# Patient Record
Sex: Female | Born: 1935 | Race: White | Hispanic: No | Marital: Married | Smoking: Never smoker
Health system: Southern US, Community
[De-identification: ages and names within clinical notes are randomized; demographics above are authoritative.]

## PROBLEM LIST (undated history)

## (undated) DIAGNOSIS — I639 Cerebral infarction, unspecified: Secondary | ICD-10-CM

## (undated) DIAGNOSIS — I10 Essential (primary) hypertension: Secondary | ICD-10-CM

## (undated) DIAGNOSIS — E039 Hypothyroidism, unspecified: Secondary | ICD-10-CM

## (undated) DIAGNOSIS — I482 Chronic atrial fibrillation, unspecified: Secondary | ICD-10-CM

## (undated) DIAGNOSIS — D72819 Decreased white blood cell count, unspecified: Secondary | ICD-10-CM

## (undated) DIAGNOSIS — D696 Thrombocytopenia, unspecified: Secondary | ICD-10-CM

## (undated) DIAGNOSIS — M1990 Unspecified osteoarthritis, unspecified site: Secondary | ICD-10-CM

## (undated) HISTORY — DX: Thrombocytopenia, unspecified: D69.6

## (undated) HISTORY — DX: Decreased white blood cell count, unspecified: D72.819

## (undated) HISTORY — DX: Essential (primary) hypertension: I10

## (undated) HISTORY — DX: Chronic atrial fibrillation, unspecified: I48.20

## (undated) HISTORY — DX: Hypothyroidism, unspecified: E03.9

## (undated) HISTORY — PX: HERNIA REPAIR: SHX51

---

## 1998-06-28 ENCOUNTER — Other Ambulatory Visit: Admission: RE | Admit: 1998-06-28 | Discharge: 1998-06-28 | Payer: Self-pay | Admitting: *Deleted

## 1999-10-25 ENCOUNTER — Encounter: Payer: Self-pay | Admitting: *Deleted

## 1999-10-25 ENCOUNTER — Encounter: Admission: RE | Admit: 1999-10-25 | Discharge: 1999-10-25 | Payer: Self-pay | Admitting: *Deleted

## 1999-11-01 ENCOUNTER — Encounter: Admission: RE | Admit: 1999-11-01 | Discharge: 1999-11-01 | Payer: Self-pay | Admitting: *Deleted

## 1999-11-01 ENCOUNTER — Other Ambulatory Visit: Admission: RE | Admit: 1999-11-01 | Discharge: 1999-11-01 | Payer: Self-pay | Admitting: *Deleted

## 1999-11-01 ENCOUNTER — Encounter: Payer: Self-pay | Admitting: *Deleted

## 1999-11-20 ENCOUNTER — Encounter: Payer: Self-pay | Admitting: *Deleted

## 1999-11-20 ENCOUNTER — Encounter: Admission: RE | Admit: 1999-11-20 | Discharge: 1999-11-20 | Payer: Self-pay | Admitting: *Deleted

## 2002-10-14 ENCOUNTER — Encounter: Admission: RE | Admit: 2002-10-14 | Discharge: 2002-10-14 | Payer: Self-pay | Admitting: Internal Medicine

## 2002-10-14 ENCOUNTER — Encounter (HOSPITAL_BASED_OUTPATIENT_CLINIC_OR_DEPARTMENT_OTHER): Payer: Self-pay | Admitting: Internal Medicine

## 2005-06-26 ENCOUNTER — Inpatient Hospital Stay (HOSPITAL_COMMUNITY): Admission: EM | Admit: 2005-06-26 | Discharge: 2005-06-29 | Payer: Self-pay | Admitting: Emergency Medicine

## 2005-06-28 ENCOUNTER — Encounter (INDEPENDENT_AMBULATORY_CARE_PROVIDER_SITE_OTHER): Payer: Self-pay | Admitting: *Deleted

## 2008-01-08 ENCOUNTER — Ambulatory Visit (HOSPITAL_COMMUNITY): Admission: RE | Admit: 2008-01-08 | Discharge: 2008-01-08 | Payer: Self-pay | Admitting: General Surgery

## 2010-11-18 ENCOUNTER — Emergency Department (HOSPITAL_COMMUNITY): Payer: No Typology Code available for payment source

## 2010-11-18 ENCOUNTER — Emergency Department (HOSPITAL_COMMUNITY)
Admission: EM | Admit: 2010-11-18 | Discharge: 2010-11-18 | Disposition: A | Discharge status: Home / Self Care | Payer: No Typology Code available for payment source | Attending: Emergency Medicine | Admitting: Emergency Medicine

## 2010-11-18 DIAGNOSIS — W1809XA Striking against other object with subsequent fall, initial encounter: Secondary | ICD-10-CM | POA: Insufficient documentation

## 2010-11-18 DIAGNOSIS — S1093XA Contusion of unspecified part of neck, initial encounter: Secondary | ICD-10-CM | POA: Insufficient documentation

## 2010-11-18 DIAGNOSIS — Z79899 Other long term (current) drug therapy: Secondary | ICD-10-CM | POA: Insufficient documentation

## 2010-11-18 DIAGNOSIS — E7800 Pure hypercholesterolemia, unspecified: Secondary | ICD-10-CM | POA: Insufficient documentation

## 2010-11-18 DIAGNOSIS — M25519 Pain in unspecified shoulder: Secondary | ICD-10-CM | POA: Insufficient documentation

## 2010-11-18 DIAGNOSIS — Y9229 Other specified public building as the place of occurrence of the external cause: Secondary | ICD-10-CM | POA: Insufficient documentation

## 2010-11-18 DIAGNOSIS — S0003XA Contusion of scalp, initial encounter: Secondary | ICD-10-CM | POA: Insufficient documentation

## 2010-11-18 DIAGNOSIS — S0990XA Unspecified injury of head, initial encounter: Secondary | ICD-10-CM | POA: Insufficient documentation

## 2010-11-18 DIAGNOSIS — Z8673 Personal history of transient ischemic attack (TIA), and cerebral infarction without residual deficits: Secondary | ICD-10-CM | POA: Insufficient documentation

## 2010-11-18 DIAGNOSIS — IMO0002 Reserved for concepts with insufficient information to code with codable children: Secondary | ICD-10-CM | POA: Insufficient documentation

## 2010-12-26 NOTE — Op Note (Signed)
NAME:  Katelyn [name], Katelyn [name]                ACCOUNT NO.:  [id number]   MEDICAL RECORD NO.:  [id number]          PATIENT TYPE:  AMB   LOCATION:  DAY                          FACILITY:  [facility]   PHYSICIAN:  Anselm Pancoast. Weatherly, M.D.DATE OF BIRTH:  24-Oct-1921   DATE OF PROCEDURE:  01/08/2008  DATE OF DISCHARGE:                               OPERATIVE REPORT   PREOPERATIVE DIAGNOSES:  Right inguinal hernia.   POSTOPERATIVE DIAGNOSIS:  Right inguinal hernia, direct.   OPERATION:  Right inguinal herniorrhaphy with __________ repair, local  and sedation.   HISTORY:  Triniti Gruetzmacher is an 77 year old Caucasian female who was  referred to me by Dr. Jarome Matin for a symptomatic right inguinal  hernia.  She has a history of mild blood pressure and I had done a  breast biopsy on her years ago and she is usually on Plavix but we  discontinued the Plavix approximately a week ago and she has been using  a baby aspirin during the interim.  She, with straining, has a definite  bulge in the right medial groin area.  She has got very kind of thin,  relaxed abdominal musculature but no other obvious hernia.  I found no  evidence of any femoral component and she is here today for the planned  procedure with local anesthesia and sedation.   DESCRIPTION OF PROCEDURE:  Preoperatively she was given a gram of Ancef  and positioned on the OR table.  The area had been marked at the site  __________ etc., a time-out completed and then the right side inguinal  pubic hair was clipped and then prepped with Betadine solution and  draped in a sterile manner.  A total of about 41 mL of 0.25% Marcaine  and 0.5% Xylocaine with adrenaline was used.  The inguinal incision area  was infiltrated after a Betadine prep with about 10 mL of this solution  and then the ilioinguinal nerve area was anesthetized, the anterior  iliac crest with about 10 mL with a blunted 22-gauge needle.  Sharp  dissection down through the skin and  subcutaneous tissue.  The  superficial veins x2 were identified, clamped, and ligated with 5 Vicryl  and then the external oblique aponeurosis was identified.  This was  followed down to the inguinal ring area and then I opened the external  oblique and used a Weitlaner.  Next, the component, whether it was  indirect or direct, I could not tell, but after the areas along the  ilioinguinal nerve were protected and separated from the structures and  then I opened up the contents, identifying a little peritoneal defect  but this was really a direct inguinal hernia sac that was all coming  medial to the epigastric vessels.  I opened the transversalis and I  pushed the peritoneum down and then first closed the first layer with  running 2-0 Vicryl and then we reclosed with the 2 tails sutured  together.  Next, I used a 2-0 Prolene starting at the pubis area and  then sutured the inguinal ligament to the conjoined tendon area, and  created  the internal ring area and then went back, suturing the 2 tails  together.  At this point I got her to strain vigorously and there was no  bulge, no excessive tension, and I elected not to place any mesh overlay  on this.  The ilioinguinal nerve was in a good comfortable position, and  then the external oblique was closed with a 2-0 Vicryl, again,  protecting the nerve.  Scarpa's fascia was closed with interrupted 3-0  Vicryl and a 4-0 Dexon subcuticular and benzoin and Steri-Strips on the  skin.  I put additional anesthetic solution in the repair of the floor,  etc., and it all totaled 42 mL, a combination used.  The patient  tolerated the procedure nicely and was awake completely at the  completion of surgery and we will let her decide whether she wants to go  home tonight or spend the night and go  home in the morning.  She will continue all of her chronic medications  for blood pressure and can restart the Plavix tomorrow.  SHE IS ALLERGIC  TO CODEINE.  I  am going to give her Darvocet hopefully for mild pain and  if she needs stronger medicine, we will try some Vicodin.  Hopefully she  can get by with the Darvocet for this.           ______________________________  Anselm Pancoast. Zachery Dakins, M.D.     WJW/MEDQ  D:  01/08/2008  T:  01/08/2008  Job:  [id number]   cc:   Barry Dienes. Eloise Harman, M.D.  Fax: [phone]

## 2010-12-29 NOTE — H&P (Signed)
NAME:  Katelyn [name], Katelyn [name]                ACCOUNT NO.:  [id number]   MEDICAL RECORD NO.:  [id number]          PATIENT TYPE:  INP   LOCATION:  1844                         FACILITY:  [facility]   PHYSICIAN:  Catherine A. Orlin Hilding, M.D.DATE OF BIRTH:  1922/06/13   DATE OF ADMISSION:  06/26/2005  DATE OF DISCHARGE:                                HISTORY & PHYSICAL   A code stroke was called at 2030, onset of symptoms was 2015.   CHIEF COMPLAINT:  Slurred speech with right facial weakness.   HISTORY OF PRESENT ILLNESS:  Ms. Schwenke is an 77 year old, right handed,  white woman with a history of hypertension.  She was at [employer], had  sudden onset of right facial weakness, slurred speech, and drooling at  around 8:15.  Friends called 911 and she arrived in 15 minutes.  Her  symptoms have been improving since she got here.   REVIEW OF SYSTEMS:  Negative for chest pain, shortness of breath, or  headache.   PAST MEDICAL HISTORY:  1.  Hypertension.  2.  Hypothyroidism.  3.  Osteoarthritis.  4.  Environmental allergies.  No diabetes, no coronary artery disease, no surgery.   MEDICATIONS:  She is on some kind of a BP medicine, she does not know what  it is, Allegra, Synthroid, Os-Cal, Centrum Silver, and Celebrex.  There may  be others.  She was not taking aspirin.  We do not know any of the doses.   ALLERGIES:  No known drug allergies.   SOCIAL HISTORY:  She is married, no cigarette use.   FAMILY HISTORY:  Noncontributory.   PHYSICAL EXAMINATION:  VITAL SIGNS:  Temperature is 97.4, pulse 76, BP is  206/110, was 205/103 on the way in.  Respirations 18, 100% sat.  HEENT:  Head is normocephalic, atraumatic.  NECK:  Supple without bruits.  HEART:  Regular rate and rhythm.  LUNGS:  Clear to auscultation.  ABDOMEN:  Positive bowel sounds.  Nontender.  EXTREMITIES:  Without edema.  NEUROLOGIC:  She scores a total of 3 on the NIH Stroke Scale.  One A Level  of consciousness, she is keenly  alert.  One B answering questions, she gets  them both right, gets a 0.  One C following commands, she follows them both,  gets a 0.  Best gaze, she has normal gaze, gets a 0.  Visual fields are  normal.  She gets a 0.  Facial:  She has a minor facial droop.  It is a  little bit waxing and waning.  When she speaks she has an obvious right  facial droop.  When she smiles and shows me her teeth, it is fairly  symmetric.  I gave her a 1 for that.  On motor exam, Five A, left upper  extremity no drift, Five B, right upper extremity just a slight drift, she  gets a 1.  Six A, left lower extremity  no drift.  Six B, right lower  extremity no drift, gets 0.  Seven, no ataxia, gets a 0.  Eight, normal  sensation, gets a 0.  She  does extinguish however.  Nine, no aphasia.  Ten,  no dysarthria.  This is a little variable, again she has a little facial  drooping but she is able to clearly read the words for me.  Eleven, she gets  1 point for slight neglect because she extinguishes the right lower  extremity fairly reliably.   CT of the brain is negative for bleed or any obvious acute.  Hemoglobin is  13.1, hematocrit 37.0, platelets 195, white blood cells 4.8.  I do not  really have anything else back yet.   IMPRESSION:  1.  Left brain stroke or transient ischemic attack.  The symptoms are      resolving.  Her score is 3 on the NIH Stroke Scale.  2.  She also has significant hypertension with blood pressure 206/110.  I      suspect this is subcortical stroke or transient ischemic attack.   PLAN:  1.  Admit her for stroke workup.  I feel that she is not a good TPA      candidate given that she is quite hypertensive above the allowable limit      and she has minimal symptoms with a score of 3.  She is also not a study      candidate because of the low score of 3.  2.  I will start her on aspirin when she passes her swallowing evaluation.      Catherine A. Orlin Hilding, M.D.  Electronically  Signed     CAW/MEDQ  D:  06/26/2005  T:  06/26/2005  Job:  [id number]

## 2010-12-29 NOTE — Discharge Summary (Signed)
NAME:  Katelyn [name], Katelyn [name]                ACCOUNT NO.:  [id number]   MEDICAL RECORD NO.:  [id number]          PATIENT TYPE:  INP   LOCATION:  3018                         FACILITY:  [facility]   PHYSICIAN:  Pramod P. Pearlean Brownie, MD    DATE OF BIRTH:  1922-08-06   DATE OF ADMISSION:  06/26/2005  DATE OF DISCHARGE:  06/29/2005                                 DISCHARGE SUMMARY   ADMISSION DIAGNOSIS:  Transient ischemic attack.   DISCHARGE DIAGNOSES:  1.  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  2.  Hypertension.  3.  Hyperlipidemia.   HOSPITAL COURSE:  Mrs. Fedder is an 77 year old pleasant Caucasian lady who  developed sudden onset of expressive speech difficulties at 8:15 on the day  of admission.  She presented within 15 minutes of onset of her symptoms;  however, since her symptoms were improving, she was not given thrombolysis  with TPA.  A CT scan of the head was unremarkable.  She was admitted to the  Stroke Service was risk stratification.  Telemetry monitoring did not reveal  any cardiac arrhythmia.  A 2-D echocardiogram showed normal ejection  fraction without any obvious cardiac source of embolism.  Transesophageal  echocardiogram was done by Dr. Jacinto Halim on June 28, 2005 which revealed no  obvious cardiac source of embolism.  There was only minimal atheromatous  plaque noted in the aortic arch.  A homocystine level was normal at 12.2.  Hemoglobin A1c was normal at 5.2.  Her cholesterol profile was abnormal with  total cholesterol 203, LDL elevated at 135, HDL 53, triglycerides 77.  The  patient was started on Aggrenox for secondary stroke prevention and Zocor  for elevated lipids.  She remained stable during hospitalization stay and  had no further symptoms.  She was discharged home in a stable condition and  advised to keep her scheduled appointment with her primary physician, Dr.  Jarome Matin, next Tuesday and was advised to see Dr. Pearlean Brownie in his office  in 2 months.  Carotid ultrasound showed no significant carotid stenosis.  Transcranial Doppler study results are pending at the time of discharge.   DISCHARGE MEDICATIONS:  1.  Aggrenox one capsule daily for 2 weeks, to be increased to twice a day.  2.  Zocor 20 mg a day.  3.  Synthroid 75 mcg once a day.  4.  Centrum Silver once a day.  5.  Os-Cal and Celebrex; the patient was advised to resume her prior home      dosages.           ______________________________  Sunny Schlein. Pearlean Brownie, MD     PPS/MEDQ  D:  06/29/2005  T:  06/30/2005  Job:  [id number]   cc:   Barry Dienes. Eloise Harman, M.D.  Fax: [phone]

## 2011-05-09 LAB — DIFFERENTIAL
Basophils Absolute: 0
Basophils Relative: 1
Eosinophils Absolute: 0.1
Eosinophils Relative: 2
Lymphocytes Relative: 33
Lymphs Abs: 1.2
Monocytes Absolute: 0.5
Monocytes Relative: 13 — ABNORMAL HIGH
Neutro Abs: 1.8
Neutrophils Relative %: 51

## 2011-05-09 LAB — COMPREHENSIVE METABOLIC PANEL
ALT: 15
AST: 22
Albumin: 3.5
Alkaline Phosphatase: 62
BUN: 15
CO2: 31
Calcium: 9.1
Chloride: 97
Creatinine, Ser: 0.86
GFR calc Af Amer: >60
GFR calc non Af Amer: >60
Glucose, Bld: 97
Potassium: 3.7
Sodium: 134 — ABNORMAL LOW
Total Bilirubin: 0.8
Total Protein: 6

## 2011-05-09 LAB — CBC
HCT: 34.6 — ABNORMAL LOW
Hemoglobin: 11.9 — ABNORMAL LOW
MCHC: 34.3
MCV: 90.3
Platelets: 144 — ABNORMAL LOW
RBC: 3.83 — ABNORMAL LOW
RDW: 13.4
WBC: 3.5 — ABNORMAL LOW

## 2011-10-04 ENCOUNTER — Emergency Department (HOSPITAL_COMMUNITY)
Admission: EM | Admit: 2011-10-04 | Discharge: 2011-10-04 | Discharge status: Against Medical Advice | Payer: No Typology Code available for payment source | Attending: Emergency Medicine | Admitting: Emergency Medicine

## 2011-10-04 DIAGNOSIS — Z0389 Encounter for observation for other suspected diseases and conditions ruled out: Secondary | ICD-10-CM | POA: Insufficient documentation
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
## 2011-11-25 ENCOUNTER — Emergency Department (HOSPITAL_COMMUNITY): Payer: No Typology Code available for payment source

## 2011-11-25 ENCOUNTER — Encounter (HOSPITAL_COMMUNITY): Payer: Self-pay

## 2011-11-25 ENCOUNTER — Emergency Department (HOSPITAL_COMMUNITY)
Admission: EM | Admit: 2011-11-25 | Discharge: 2011-11-25 | Disposition: A | Discharge status: Home / Self Care | Payer: No Typology Code available for payment source | Attending: Emergency Medicine | Admitting: Emergency Medicine

## 2011-11-25 DIAGNOSIS — R51 Headache: Secondary | ICD-10-CM | POA: Insufficient documentation

## 2011-11-25 DIAGNOSIS — R079 Chest pain, unspecified: Secondary | ICD-10-CM | POA: Insufficient documentation

## 2011-11-25 DIAGNOSIS — M25519 Pain in unspecified shoulder: Secondary | ICD-10-CM | POA: Insufficient documentation

## 2011-11-25 HISTORY — DX: Cerebral infarction, unspecified: I63.9

## 2011-11-25 HISTORY — DX: Unspecified osteoarthritis, unspecified site: M19.90

## 2011-11-25 LAB — POCT I-STAT, CHEM 8
BUN: 21 mg/dL (ref 6–23)
Calcium, Ion: 1.21 mmol/L (ref 1.12–1.32)
Chloride: 103 mEq/L (ref 96–112)
Creatinine, Ser: 1 mg/dL (ref 0.50–1.10)
Glucose, Bld: 112 mg/dL — ABNORMAL HIGH (ref 70–99)
Hemoglobin: 13.6 g/dL (ref 12.0–15.0)
Potassium: 3.5 mEq/L (ref 3.5–5.1)
Sodium: 142 mEq/L (ref 135–145)
TCO2: 29 mmol/L (ref 0–100)

## 2011-11-25 MED ORDER — HYDROCODONE-ACETAMINOPHEN 5-325 MG PO TABS
1.0000 | ORAL_TABLET | Freq: Once | ORAL | Status: AC
  Administered 2011-11-25: 1 via ORAL
  Filled 2011-11-25: qty 1

## 2011-11-25 MED ORDER — HYDROCODONE-ACETAMINOPHEN 5-500 MG PO TABS: 1.0000 | ORAL_TABLET | Freq: Four times a day (QID) | ORAL | Status: AC | PRN

## 2011-11-25 NOTE — ED Notes (Signed)
Patient is AOx4 and comfortable with her discharge instructions. 

## 2011-11-25 NOTE — ED Notes (Addendum)
Family at beside. 1 pair yellow colored earrings and necklace placed in collection cup and into pt's purse, all belongings given to husband at bedside.

## 2011-11-25 NOTE — ED Notes (Signed)
Vital signs stable. 
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
## 2011-11-25 NOTE — ED Notes (Signed)
MD at bedside. 
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
## 2011-11-25 NOTE — ED Provider Notes (Signed)
I saw and evaluated the patient, reviewed the resident's note and I agree with the findings and plan.   .Face to face Exam:  General:  Awake HEENT:  Atraumatic Resp:  Normal effort Abd:  Nondistended Neuro:No focal weakness Lymph: No adenopathy   Nelia Shi, MD 11/25/11 2351
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
## 2011-12-12 ENCOUNTER — Encounter (HOSPITAL_COMMUNITY): Payer: Self-pay | Admitting: *Deleted

## 2011-12-12 ENCOUNTER — Emergency Department (INDEPENDENT_AMBULATORY_CARE_PROVIDER_SITE_OTHER): Payer: No Typology Code available for payment source

## 2011-12-12 ENCOUNTER — Emergency Department (INDEPENDENT_AMBULATORY_CARE_PROVIDER_SITE_OTHER)
Admission: EM | Admit: 2011-12-12 | Discharge: 2011-12-12 | Disposition: A | Discharge status: Home / Self Care | Payer: Self-pay | Source: Home / Self Care | Attending: Emergency Medicine | Admitting: Emergency Medicine

## 2011-12-12 DIAGNOSIS — R911 Solitary pulmonary nodule: Secondary | ICD-10-CM

## 2011-12-12 DIAGNOSIS — R0789 Other chest pain: Secondary | ICD-10-CM

## 2011-12-12 DIAGNOSIS — R071 Chest pain on breathing: Secondary | ICD-10-CM

## 2011-12-12 DIAGNOSIS — S43429A Sprain of unspecified rotator cuff capsule, initial encounter: Secondary | ICD-10-CM

## 2011-12-12 DIAGNOSIS — S46019A Strain of muscle(s) and tendon(s) of the rotator cuff of unspecified shoulder, initial encounter: Secondary | ICD-10-CM

## 2011-12-12 MED ORDER — TRAMADOL HCL 50 MG PO TABS: 100.0000 mg | ORAL_TABLET | Freq: Three times a day (TID) | ORAL | Status: AC | PRN

## 2011-12-12 NOTE — Discharge Instructions (Signed)
Chest Pain (Nonspecific) It is often hard to give a specific diagnosis for the cause of chest pain. There is always a chance that your pain could be related to something serious, such as a heart attack or a blood clot in the lungs. You need to follow up with your caregiver for further evaluation. CAUSES   Heartburn.   Pneumonia or bronchitis.   Anxiety or stress.   Inflammation around your heart (pericarditis) or lung (pleuritis or pleurisy).   A blood clot in the lung.   A collapsed lung (pneumothorax). It can develop suddenly on its own (spontaneous pneumothorax) or from injury (trauma) to the chest.   Shingles infection (herpes zoster virus).  The chest wall is composed of bones, muscles, and cartilage. Any of these can be the source of the pain.  The bones can be bruised by injury.   The muscles or cartilage can be strained by coughing or overwork.   The cartilage can be affected by inflammation and become sore (costochondritis).  DIAGNOSIS  Lab tests or other studies, such as X-rays, electrocardiography, stress testing, or cardiac imaging, may be needed to find the cause of your pain.  TREATMENT   Treatment depends on what may be causing your chest pain. Treatment may include:   Acid blockers for heartburn.   Anti-inflammatory medicine.   Pain medicine for inflammatory conditions.   Antibiotics if an infection is present.   You may be advised to change lifestyle habits. This includes stopping smoking and avoiding alcohol, caffeine, and chocolate.   You may be advised to keep your head raised (elevated) when sleeping. This reduces the chance of acid going backward from your stomach into your esophagus.   Most of the time, nonspecific chest pain will improve within 2 to 3 days with rest and mild pain medicine.  HOME CARE INSTRUCTIONS   If antibiotics were prescribed, take your antibiotics as directed. Finish them even if you start to feel better.   For the next few  days, avoid physical activities that bring on chest pain. Continue physical activities as directed.   Do not smoke.   Avoid drinking alcohol.   Only take over-the-counter or prescription medicine for pain, discomfort, or fever as directed by your caregiver.   Follow your caregiver's suggestions for further testing if your chest pain does not go away.   Keep any follow-up appointments you made. If you do not go to an appointment, you could develop lasting (chronic) problems with pain. If there is any problem keeping an appointment, you must call to reschedule.  SEEK MEDICAL CARE IF:   You think you are having problems from the medicine you are taking. Read your medicine instructions carefully.   Your chest pain does not go away, even after treatment.   You develop a rash with blisters on your chest.  SEEK IMMEDIATE MEDICAL CARE IF:   You have increased chest pain or pain that spreads to your arm, neck, jaw, back, or abdomen.   You develop shortness of breath, an increasing cough, or you are coughing up blood.   You have severe back or abdominal pain, feel nauseous, or vomit.   You develop severe weakness, fainting, or chills.   You have a fever.  THIS IS AN EMERGENCY. Do not wait to see if the pain will go away. Get medical help at once. Call your local emergency services (911 in [place] Do not drive yourself to the hospital. MAKE SURE YOU:   Understand these instructions.     Will watch your condition.   Will get help right away if you are not doing well or get worse.  Document Released: 05/09/2005 Document Revised: 07/19/2011 Document Reviewed: 03/04/2008 [facility] Patient Information 2012 [place], [place].Chest Wall Pain Chest wall pain is pain in or around the bones and muscles of your chest. It may take up to 6 weeks to get better. It may take longer if you must stay physically active in your work and activities.  CAUSES  Chest wall pain may happen on its own. However, it may  be caused by:  A viral illness like the flu.   Injury.   Coughing.   Exercise.   Arthritis.   Fibromyalgia.   Shingles.  HOME CARE INSTRUCTIONS   Avoid overtiring physical activity. Try not to strain or perform activities that cause pain. This includes any activities using your chest or your abdominal and side muscles, especially if heavy weights are used.   Put ice on the sore area.   Put ice in a plastic bag.   Place a towel between your skin and the bag.   Leave the ice on for 15 to 20 minutes per hour while awake for the first 2 days.   Only take over-the-counter or prescription medicines for pain, discomfort, or fever as directed by your caregiver.  SEEK IMMEDIATE MEDICAL CARE IF:   Your pain increases, or you are very uncomfortable.   You have a fever.   Your chest pain becomes worse.   You have new, unexplained symptoms.   You have nausea or vomiting.   You feel sweaty or lightheaded.   You have a cough with phlegm (sputum), or you cough up blood.  MAKE SURE YOU:   Understand these instructions.   Will watch your condition.   Will get help right away if you are not doing well or get worse.  Document Released: 07/30/2005 Document Revised: 07/19/2011 Document Reviewed: 03/26/2011 [facility] Patient Information 2012 [place], [place].Incidental Abnormal Radiological Finding An incidental abnormal radiologic finding is a very small mass or scar tissue, detected anywhere in the body, unrelated to the reason for your visit. With newer imaging tests and technologies, it is becoming more common to detect small masses and tissue abnormalities. It is important for you to work with your caregiver because it can sometimes be related to undiagnosed illness or other symptoms. Most often however, the finding is not causing symptoms and is not a cause for concern. TYPES OF FINDINGS Abnormal radiologic findings are often located in the kidneys or lungs, but they can also be  found in the heart, liver, breasts, brain, gallbladder, uterus and other surrounding organs and tissues. There are many types of masses and tissue abnormalities that can be detected during an imaging test. These may include:  Lesions - changes in tissue due to infection, tissue death, or trauma.   Cysts - a sac filled with fluid, crystals, or some other substance.   Tumors - non-cancerous or cancerous solid formation.  You may hear medical terms, such as, "pulmonary nodule" (a small mass in the lung) or "renal mass" (mass in the kidney). Ask your caregiver if these terms apply to your findings.  DO I NEED FURTHER DIAGNOSIS? There are many possible causes of incidental radiologic findings. Your caregiver will determine whether it requires additional screening tests, diagnostic tests, treatments, or referral to a surgeon.Generally, very small tissue changes or masses will not require any follow up testing. Much research has been done in this area.These very small  abnormalities are considered low risk of becoming a problem in the future.  Depending on the size and appearance of the finding, your caregiver may recommend additional testing.Additional testing may also be recommended if you have certain risk factors or medical conditions that increase your risk of related problems. It is a good idea to have additional testing if you have other symptoms or concerns. Sometimes, these early findings can give you a chance at early treatment and avoid problems in the future. TESTING AND DIAGNOSIS Tests and exams may be a one-time screening or periodic follow-up. Periodic follow-up will help your caregiver determine whether the abnormality is growing and becoming a concern. Tests may include:  Physical examination.   Blood tests.   Urine tests.   Imaging tests, such as abdominal ultrasound, CT scan, or MRI.   Biopsy.  TREATMENT Treatment varies, depending on the cause, location, size and appearance  of the finding. Treatment will also depend on your age and underlying conditions or symptoms. Sometimes treatment is not necessary at all. However, treatment may include:  Watchful waiting with periodic examination and testing.   Treatments to reduce the size of the abnormality.   Surgical or biopsy removal of the abnormality.   Additional treatments to address any underlying conditions.  HOME CARE INSTRUCTIONS   See your caregiver for follow up examination and testing as directed. It is important that you schedule appointments as directed.   Keep calm. Your caregiver will let you know if this is a routine follow up, or if there is a reason for concern. Remember, early detection can be very beneficial to you.   Follow all of your caregiver's aftercare instructions related to the reason for your visit.  Document Released: 11/14/2010 Document Revised: 07/19/2011 Document Reviewed: 11/14/2010 [facility] Patient Information 2012 [place], [place].Ligament Sprain Ligaments are tough, fibrous tissues that hold bones together at the joints. A sprain can occur when a ligament is stretched. This injury may take several weeks to heal. HOME CARE INSTRUCTIONS   Rest the injured area for as long as directed by your caregiver. Then slowly start using the joint as directed by your caregiver and as the pain allows.   Keep the affected joint raised if possible to lessen swelling.   Apply ice for 15 to 20 minutes to the injured area every couple hours for the first half day, then 3 to 4 times per day for the first 48 hours. Put the ice in a plastic bag and place a towel between the bag of ice and your skin.   Wear any splinting, casting, or elastic bandage applications as instructed.   Only take over-the-counter or prescription medicines for pain, discomfort, or fever as directed by your caregiver. Do not use aspirin immediately after the injury unless instructed by your caregiver. Aspirin can cause increased  bleeding and bruising of the tissues.   If you were given crutches, continue to use them as instructed and do not resume weight bearing on the affected extremity until instructed.  SEEK MEDICAL CARE IF:   Your bruising, swelling, or pain increases.   You have cold and numb fingers or toes if your arm or leg was injured.  SEEK IMMEDIATE MEDICAL CARE IF:   Your toes are numb or blue if your leg was injured.   Your fingers are numb or blue if your arm was injured.   Your pain is not responding to medicines and continues to stay the same or gets worse.  MAKE SURE YOU:  Understand these instructions.   Will watch your condition.   Will get help right away if you are not doing well or get worse.  Document Released: 07/27/2000 Document Revised: 07/19/2011 Document Reviewed: 05/25/2008 [facility] Patient Information 2012 [place], [place].Pulmonary Nodule A pulmonary (lung) nodule is small, round growth in the lung. The size of a pulmonary nodule can be as small as a pencil eraser (1/5 inch or 4 millmeters) to a little bigger than your biggest toenail (1 inch or 25 millimeters). A pulmonary nodule is usually an unplanned finding. It may be found on a chest X-ray or a computed tomography (CT) scan when you have imaging tests of your lungs done. When a pulmonary nodule is found, tests will be done to determine if the nodule is benign (not cancerous) or malignant (cancerous). Follow-up treatment or testing is based on the size of the pulmonary nodule and your risk of getting lung cancer.  CAUSES Causes of pulmonary nodules can vary.  Benign pulmonary nodules  can be caused from different things. Some of these things include:  Infection. This can be a common cause of a benign pulmonary nodule. The infection may be active (a current infection) or an old infection that is no longer active. Three types of infections can cause a pulmonary nodule. These are:   Bacterial Infection.   Fungal infection.     Viral Infections.   Hematoma. This is a bruise in the lung. A hematoma can happen from an injury to your chest.   Some common diseases can lead to benign pulmonary nodules. For example, rheumatoid arthritis can be a cause of a pulmonary nodule.   Other unusual things can cause a benign pulmonary nodule. These can include:   Having had tuberculosis.   Rare diseases, such as a lung cyst.  Malignant pulmonary nodules.  These are cancerous growths. The cancer may have:  Started in the lung. Some lung cancers first detected as a pulmonary nodule.   Spread to the lung from cancer somewhere else in the body. This is called metastatic cancer.   Certain risk factors make a cancerous pulmonary nodule more likely. They include:   Age. As people get older, a pulmonary nodule is more likely to be cancerous.   Cancer history. If one of your immediate family members has had cancer, you have a higher risk of developing cancer.   Smoking. This includes people who currently smoke and those who have quit.  DIAGNOSIS To diagnose whether a pulmonary nodule is benign or malignant, a variety of tests will be done. This includes things such as:  Health history. Questions regarding your current health, past health, and family health will be asked.   Blood tests. Results of blood work can show:   Tumor markers for cancer.   Any type of infection.   A skin test called a tuberculin (TB) test may be done. This test can tell if you have been exposed to the germ that causes tuberculosis.   Imaging tests. These take pictures of your lungs. Types of imaging tests include:   Chest X-ray. This can help in several ways. An X-ray gives a close-up look at the pulmonary nodule. A new X-ray can be compared with any X-rays you have had in the past.    Computed tomography  (CT) scan. This test shows smaller pulmonary nodules more clearly than an X-ray.   Positron emission tomography  (PET) scan. This is a  test that uses a radioactive substance to identify a pulmonary  nodule. A safe amount of radioactive substance is injected into the blood stream. Then, the scan takes a picture of the pulmonary nodule. A malignant pulmonary nodule will absorb the substance faster than a benign pulmonary nodule. The radioactive substance is eliminated from your body in your urine.   Biopsy.  This removes a tiny piece of the pulmonary nodule so it can be checked under a microscope. Medicine will be given to help keep you relaxed and pain free when a biopsy is done. Types of biopsies include:   Bronchoscopy . This is a surgical procedure. It can be used for pulmonary nodules that are close to the airways in the lung. It uses a scope (a thin tube) with a tiny camera and light on the end. The scope is put in the windpipe. Your caregiver can then see inside the lung. A tiny tool put through the scope is used to take a small sample of the pulmonary nodule tissue.   Transthoracic needle aspiration . This method is used if the pulmonary nodule is far away from the air passages in the lung. A long, thin needle is put through the chest into the lung nodule. A CT scan is done at the same time which can make it easier to locate the pulmonary nodule.   Surgical lung biopsy . This is a surgical procedure in which the pulmonary nodule is removed. This is usually recommended when the pulmonary nodule is most likely malignant or a biopsy cannot be obtained by either bronchoscopy or transthoracic needle aspiration.  PULMONARY NODULE FOLLOW-UP RECOMMENDATIONS The frequency of pulmonary nodule follow-up is based on your risk factors and size of the pulmonary nodule. If your caregiver suspects the pulmonary nodule is cancerous or the pulmonary nodule changes during any of the follow-up CT scans, additional testing or biopsies will be done.   If you have no or low risk of getting lung cancer (non-smoker, no personal cancer history),  recommended follow-up is based on the following pulmonary nodule size:   A pulmonary nodule that is < 4 mm does not require any follow-up.   A pulmonary nodule that is 4 to 6 mm should be re-imaged by CT scan in 12 months.   A pulmonary nodule that is 6 to 8 mm should be re-imaged by CT scan at 6 to 12 months and then again at 18 to 24 months if no change in size.   A pulmonary nodule > 8 mm in size should be followed closely and re-imaged by CT scan at 3, 9, and 24 months.    If you are at risk of getting lung cancer (current or former smoker, family history of cancer), recommended follow-up is based on the following pulmonary nodule size:   A pulmonary nodule that is < 4 mm in size should be re-imaged by CT scan in 12 months.   A pulmonary nodule that is 4 to 6 mm in size should be re-imaged by CT scan at 6 to 12 months and again at 18 to 24 months.   A pulmonary nodule that is 6 to 8 mm in size should be re-imaged by CT scan at 3, 9, and 24 months.   A pulmonary nodule > 8 mm in size should be followed closely and re-imaged by CT scan at 3, 9, and 24 months.  SEEK MEDICAL CARE IF: While waiting for test results to determine what type of pulmonary nodule you have, be sure to contact your caregiver if you:  Have  trouble breathing when you are active.   Feel sick or unusually tired.   Do not feel like eating.   Lose weight without trying to.   Develop chills or night sweats.   Mild or moderate fevers generally have no long-term effects and often do not require treatment. There are a few exceptions (see below).  SEEK IMMEDIATE MEDICAL CARE IF:  You cannot catch your breath or you begin wheezing.   You cannot stop coughing.   You cough up blood.   You feel like you are going to pass out or become dizzy.   You have sudden chest pain.   You have a fever or persistent symptoms for more than 72 hours.   You have a fever and your symptoms suddenly get worse.  MAKE SURE YOU    Understand these instructions.   Will watch your condition.   Will get help right away if you are not doing well or get worse.  Document Released: 05/27/2009 Document Revised: 07/19/2011 Document Reviewed: 05/27/2009 [facility] Patient Information 2012 [place], [place].    Be sure to follow up with your primary care doctor regarding the spot on your lung.  I will call and let them know.
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
## 2012-05-21 ENCOUNTER — Emergency Department (HOSPITAL_COMMUNITY): Payer: Medicare Other

## 2012-05-21 ENCOUNTER — Other Ambulatory Visit: Payer: Self-pay

## 2012-05-21 ENCOUNTER — Observation Stay (HOSPITAL_COMMUNITY)
Admission: EM | Admit: 2012-05-21 | Discharge: 2012-05-22 | Disposition: A | Discharge status: Home / Self Care | Payer: Medicare Other | Attending: Internal Medicine | Admitting: Internal Medicine

## 2012-05-21 DIAGNOSIS — I10 Essential (primary) hypertension: Secondary | ICD-10-CM | POA: Insufficient documentation

## 2012-05-21 DIAGNOSIS — E039 Hypothyroidism, unspecified: Secondary | ICD-10-CM | POA: Insufficient documentation

## 2012-05-21 DIAGNOSIS — R269 Unspecified abnormalities of gait and mobility: Secondary | ICD-10-CM | POA: Insufficient documentation

## 2012-05-21 DIAGNOSIS — I4891 Unspecified atrial fibrillation: Secondary | ICD-10-CM | POA: Insufficient documentation

## 2012-05-21 DIAGNOSIS — G459 Transient cerebral ischemic attack, unspecified: Secondary | ICD-10-CM | POA: Diagnosis present

## 2012-05-21 DIAGNOSIS — R4789 Other speech disturbances: Secondary | ICD-10-CM | POA: Insufficient documentation

## 2012-05-21 DIAGNOSIS — R29810 Facial weakness: Secondary | ICD-10-CM | POA: Insufficient documentation

## 2012-05-21 DIAGNOSIS — R471 Dysarthria and anarthria: Secondary | ICD-10-CM | POA: Insufficient documentation

## 2012-05-21 DIAGNOSIS — I6350 Cerebral infarction due to unspecified occlusion or stenosis of unspecified cerebral artery: Principal | ICD-10-CM | POA: Insufficient documentation

## 2012-05-21 LAB — CBC
MCHC: 34.3 g/dL (ref 30.0–36.0)
MCV: 89.6 fL (ref 78.0–100.0)
Platelets: 148 10*3/uL — ABNORMAL LOW (ref 150–400)
Platelets: 145 10*3/uL — ABNORMAL LOW (ref 150–400)
RBC: 4.21 MIL/uL (ref 3.87–5.11)
RDW: 13.4 % (ref 11.5–15.5)
RDW: 13.4 % (ref 11.5–15.5)
WBC: 5.1 10*3/uL (ref 4.0–10.5)
WBC: 4 10*3/uL (ref 4.0–10.5)

## 2012-05-21 LAB — DIFFERENTIAL
Basophils Absolute: 0 10*3/uL (ref 0.0–0.1)
Basophils Relative: 0 % (ref 0–1)
Eosinophils Relative: 1 % (ref 0–5)
Lymphocytes Relative: 22 % (ref 12–46)
Neutro Abs: 3.5 10*3/uL (ref 1.7–7.7)

## 2012-05-21 LAB — PROTIME-INR: Prothrombin Time: 14.3 seconds (ref 11.6–15.2)

## 2012-05-21 LAB — COMPREHENSIVE METABOLIC PANEL
ALT: 25 U/L (ref 0–35)
AST: 30 U/L (ref 0–37)
Albumin: 3.9 g/dL (ref 3.5–5.2)
CO2: 27 mEq/L (ref 19–32)
Calcium: 9.9 mg/dL (ref 8.4–10.5)
GFR calc non Af Amer: 73 mL/min — ABNORMAL LOW (ref >90)
Sodium: 136 mEq/L (ref 135–145)

## 2012-05-21 LAB — POCT I-STAT TROPONIN I: Troponin i, poc: 0 ng/mL (ref 0.00–0.08)

## 2012-05-21 LAB — APTT: aPTT: 41 seconds — ABNORMAL HIGH (ref 24–37)

## 2012-05-21 NOTE — H&P (Addendum)
PCP:   Garlan Fillers, MD   Chief Complaint:  Slurred speech  HPI: This is 78 year old female was a home in that department at 5 PM she states she had a piece of paper hand and it fell. She bent over to pick up the paper up and found that she could not. On getting up she felt odd. Her speech became very slurred and incomprehensible. She reports no other localized weakness, she had some facial drooping. She was brought to the ER. By time patient was seen in the ER her symptoms were improving, and during my interview the symptoms had resolved. History provided by the patient. Patient has a history of atrial fibrillation, she declined to Coumadin. She continues to decline Coumadin.  Review of Systems:  The patient denies anorexia, fever, weight loss,, vision loss, decreased hearing, hoarseness, chest pain, syncope, dyspnea on exertion, peripheral edema, balance deficits, hemoptysis, abdominal pain, melena, hematochezia, severe indigestion/heartburn, hematuria, incontinence, genital sores, muscle weakness, suspicious skin lesions, transient blindness, difficulty walking, depression, unusual weight change, abnormal bleeding, enlarged lymph nodes, angioedema, and breast masses.  Past Medical History: Past Medical History  Diagnosis Date  . Hypertension   . Thyroid disease   . Arthritis   . Stroke    Past Surgical History  Procedure Date  . Hernia repair     Medications: Prior to Admission medications   Medication Sig Start Date End Date Taking? Authorizing Provider  bisoprolol-hydrochlorothiazide (ZIAC) 5-6.25 MG per tablet Take 1 tablet by mouth every morning.    Yes Historical Provider, MD  clopidogrel (PLAVIX) 75 MG tablet Take 75 mg by mouth daily.   Yes Historical Provider, MD  levothyroxine (SYNTHROID, LEVOTHROID) 75 MCG tablet Take 75 mcg by mouth daily. Not on Sundays   Yes Historical Provider, MD  lovastatin (MEVACOR) 40 MG tablet Take 40 mg by mouth at bedtime.   Yes Historical  Provider, MD  Vitamin D, Ergocalciferol, (DRISDOL) 50000 UNITS CAPS Take 50,000 Units by mouth every 7 (seven) days. On Saturdays   Yes Historical Provider, MD    Allergies:  No Known Allergies  Social History:  reports that she has never smoked. She does not have any smokeless tobacco history on file. She reports that she does not drink alcohol or use illicit drugs.  Family History: No family history on file.  Physical Exam: Filed Vitals:   05/21/12 1957 05/21/12 2035  BP: 163/85   Pulse: 68   Temp: 97.9 F (36.6 C) 97.9 F (36.6 C)  TempSrc: Oral   Resp: 18   SpO2: 98%     General:  Alert and oriented times three, well developed and nourished, no acute distress Eyes: PERRLA, pink conjunctiva, no scleral icterus ENT: Moist oral mucosa, neck supple, no thyromegaly Lungs: clear to ascultation, no wheeze, no crackles, no use of accessory muscles Cardiovascular: regular rate and rhythm, no regurgitation, no gallops, no murmurs. No carotid bruits, no JVD Abdomen: soft, positive BS, non-tender, non-distended, no organomegaly, not an acute abdomen GU: not examined Neuro: CN II - XII grossly intact, sensation intact Musculoskeletal: strength 5/5 all extremities, no clubbing, cyanosis or edema Skin: no rash, no subcutaneous crepitation, no decubitus Psych: appropriate patient   Labs on Admission:   [facility] 05/21/12 2000  NA 136  K 3.3*  CL 99  CO2 27  GLUCOSE 97  BUN 17  CREATININE 0.77  CALCIUM 9.9  MG --  PHOS --    Basename 05/21/12 2000  AST 30  ALT 25  ALKPHOS  63  BILITOT 0.7  PROT 6.8  ALBUMIN 3.9   No results found for this basename: LIPASE:2,AMYLASE:2 in the last 72 hours  Basename 05/21/12 2000  WBC 5.1  NEUTROABS 3.5  HGB 12.7  HCT 37.0  MCV 89.6  PLT 148*    Basename 05/21/12 2000  CKTOTAL --  CKMB --  CKMBINDEX --  TROPONINI <0.30   No components found with this basename: POCBNP:3 No results found for this basename: DDIMER:2 in the  last 72 hours No results found for this basename: HGBA1C:2 in the last 72 hours No results found for this basename: CHOL:2,HDL:2,LDLCALC:2,TRIG:2,CHOLHDL:2,LDLDIRECT:2 in the last 72 hours No results found for this basename: TSH,T4TOTAL,FREET3,T3FREE,THYROIDAB in the last 72 hours No results found for this basename: VITAMINB12:2,FOLATE:2,FERRITIN:2,TIBC:2,IRON:2,RETICCTPCT:2 in the last 72 hours  Micro Results: No results found for this or any previous visit (from the past 240 hour(s)).   Radiological Exams on Admission: Ct Head Wo Contrast  05/21/2012  *RADIOLOGY REPORT*  Clinical Data: Code stroke, right-sided facial droop and slurred speech  CT HEAD WITHOUT CONTRAST  Technique:  Contiguous axial images were obtained from the base of the skull through the vertex without contrast.  Comparison: None.  Findings:  Grossly unchanged age advanced atrophy with corresponding mild ex vacuo dilatation of the ventricular system.  Grossly unchanged scattered periventricular hypodensities, left greater than right (image 15), compatible microvascular ischemic disease.  Right basal ganglia calcifications.  Given background parenchymal abnormalities, there is no CT evidence of acute large territory infarct.  No intraparenchymal or extra-axial mass or hemorrhage. Normal size and configuration of the ventricles and basilar cisterns.  No midline shift.  Limited visualization of the paranasal sinuses and mastoid air cells are normal.  Regional soft tissues are normal.  No displaced calvarial fracture.  IMPRESSION: Unchanged atrophy and microvascular ischemic disease without acute intracranial process.  Above findings discussed with Dr. Lorenso Courier at [phone].   Original Report Authenticated By: Waynard Reeds, M.D.    EKG: Atrial fibrillation  Assessment/Plan Present on Admission:  .TIA (transient ischemic attack) Atrial fibrillation Admit to 3 neuro 23hr observation TIA stroke protocol likely embolic in  nature Neurologist recommended that an aspirin, patient has declined. Stating she does doctor her PCP first. Her daughter is currently at bedside, she states we should go ahead and add aspirin Lipid panel continue statin. 2-D echo to evaluate for blood clots  Continue Plavix and add aspirin Hypertension Hypothyroidism Stable, resume home medication   Full code DVT prophylaxis   Legend Tumminello 05/21/2012, 10:54 PM

## 2012-05-21 NOTE — Code Documentation (Signed)
Patient at home with husband cooking dinner when at approximately 1800 patient developed difficulty speaking and facial droop. Code stroke called at 1918, patient arrived via EMS at 5, EDP exam at 17, stroke team arrived at 29, [place] by patient's husband at 11, patient arrived in CT at 1934, phlebotomist arrived at 58, CT read by Dr. Pearlean Brownie at [phone]. Patient's speech improving. NIH 2 upon arrival to ED. Will continue to monitor.

## 2012-05-21 NOTE — ED Notes (Signed)
Assisted up to bedside commode without difficulty.  Back into the bed

## 2012-05-21 NOTE — ED Provider Notes (Signed)
History     CSN: [id number]  Arrival date & time 05/21/12  7829   First MD Initiated Contact with Patient 05/21/12 1944      Chief Complaint  Patient presents with  . Code Stroke    (Consider location/radiation/quality/duration/timing/severity/associated sxs/prior treatment) HPI Comments: Katelyn [name] presents with her husband via EMS for evaluation of acute onset difficulty speaking.  It began at about 18:00.  Her husband state she was clutching her chest and mumbling.  It appeared as if her face was twisted.  She states that she could not form her words.  She denies any chest pain, diaphoresis, palpitations, or shortness of breath.  She denies any headache, visual changes, or unilateral weakness.  Her symptoms have significantly improved over the last 20 minutes per her husband.  The history is provided by the patient and the spouse. No language interpreter was used.    Past Medical History  Diagnosis Date  . Hypertension   . Thyroid disease   . Arthritis   . Stroke     Past Surgical History  Procedure Date  . Hernia repair     No family history on file.  History  Substance Use Topics  . Smoking status: Never Smoker   . Smokeless tobacco: Not on file  . Alcohol Use: No    OB History    Grav Para Term Preterm Abortions TAB SAB Ect Mult Living                  Review of Systems  Constitutional: Negative for fever, chills, activity change, appetite change and fatigue.  HENT: Negative for ear pain, congestion, facial swelling, rhinorrhea, drooling, mouth sores, trouble swallowing, neck pain, voice change and tinnitus.   Eyes: Negative for pain and visual disturbance.  Respiratory: Negative for apnea, cough and shortness of breath.   Cardiovascular: Negative for chest pain, palpitations and leg swelling.  Gastrointestinal: Negative for nausea, vomiting, abdominal pain, diarrhea and abdominal distention.  Genitourinary: Negative.   Musculoskeletal: Positive for  gait problem (chronic shuffling gait). Negative for back pain and arthralgias.  Skin: Negative for pallor and rash.  Neurological: Positive for facial asymmetry and speech difficulty. Negative for dizziness, tremors, seizures, syncope, weakness, light-headedness, numbness and headaches.  Psychiatric/Behavioral: Negative for dysphoric mood and decreased concentration.    Allergies  Review of patient's allergies indicates no known allergies.  Home Medications   Current Outpatient Rx  Name Route Sig Dispense Refill  . BISOPROLOL-HYDROCHLOROTHIAZIDE 5-6.25 MG PO TABS Oral Take 1 tablet by mouth daily.    Marland Kitchen CLOPIDOGREL BISULFATE 75 MG PO TABS Oral Take 75 mg by mouth daily.    Marland Kitchen LEVOTHYROXINE SODIUM 75 MCG PO TABS Oral Take 75 mcg by mouth daily.      BP 163/85  Pulse 68  Temp 97.9 F (36.6 C) (Oral)  Resp 18  SpO2 98%  Physical Exam  Nursing note and vitals reviewed. Constitutional: She is oriented to person, place, and time. She appears well-developed and well-nourished. No distress.  HENT:  Head: Normocephalic and atraumatic.  Right Ear: External ear normal.  Left Ear: External ear normal.  Nose: Nose normal.  Mouth/Throat: Oropharynx is clear and moist. No oropharyngeal exudate.  Eyes: Conjunctivae normal and EOM are normal. Pupils are equal, round, and reactive to light. Right eye exhibits no discharge. Left eye exhibits no discharge. No scleral icterus.  Neck: Normal range of motion. Neck supple. No JVD present. No tracheal deviation present. No thyromegaly present.  Cardiovascular: Normal  rate, intact distal pulses and normal pulses.  An irregularly irregular rhythm present. PMI is not displaced.  Exam reveals no gallop and no decreased pulses.   Murmur heard. Pulmonary/Chest: Effort normal and breath sounds normal. No stridor. No respiratory distress. She has no wheezes. She has no rales. She exhibits no tenderness.  Abdominal: Soft. Bowel sounds are normal. She exhibits no  distension and no mass. There is no tenderness. There is no rebound and no guarding.  Musculoskeletal: Normal range of motion. She exhibits no edema and no tenderness.  Lymphadenopathy:    She has no cervical adenopathy.  Neurological: She is alert and oriented to person, place, and time. She displays no atrophy, no tremor and normal reflexes. A cranial nerve deficit is present. No sensory deficit. She exhibits normal muscle tone. She displays no seizure activity. Coordination normal. GCS eye subscore is 4. GCS verbal subscore is 5. GCS motor subscore is 6. She displays no Babinski's sign on the right side. She displays no Babinski's sign on the left side.       Mild facial droop on right.  Note expressive aphasia.  Words are understandable but jumbled and heavy.   Skin: Skin is warm and dry. No rash noted. She is not diaphoretic. No erythema. No pallor.  Psychiatric: She has a normal mood and affect. Her behavior is normal.    ED Course  Procedures (including critical care time)  Labs Reviewed  CBC - Abnormal; Notable for the following:    Platelets 148 (*)     All other components within normal limits  DIFFERENTIAL  PROTIME-INR  APTT  COMPREHENSIVE METABOLIC PANEL  TROPONIN I   Ct Head Wo Contrast  05/21/2012  *RADIOLOGY REPORT*  Clinical Data: Code stroke, right-sided facial droop and slurred speech  CT HEAD WITHOUT CONTRAST  Technique:  Contiguous axial images were obtained from the base of the skull through the vertex without contrast.  Comparison: None.  Findings:  Grossly unchanged age advanced atrophy with corresponding mild ex vacuo dilatation of the ventricular system.  Grossly unchanged scattered periventricular hypodensities, left greater than right (image 15), compatible microvascular ischemic disease.  Right basal ganglia calcifications.  Given background parenchymal abnormalities, there is no CT evidence of acute large territory infarct.  No intraparenchymal or extra-axial mass  or hemorrhage. Normal size and configuration of the ventricles and basilar cisterns.  No midline shift.  Limited visualization of the paranasal sinuses and mastoid air cells are normal.  Regional soft tissues are normal.  No displaced calvarial fracture.  IMPRESSION: Unchanged atrophy and microvascular ischemic disease without acute intracranial process.  Above findings discussed with Dr. Lorenso Courier at [phone].   Original Report Authenticated By: Waynard Reeds, M.D.      No diagnosis found.   Date: 05/21/2012  Rate: 76 bpm  Rhythm: atrial fibrillation  QRS Axis: normal  Intervals: normal  ST/T Wave abnormalities: nonspecific ST changes  Conduction Disutrbances:borderline repolarization abnormality  Narrative Interpretation: rate controlled atrial fibrillation  Old EKG Reviewed: unchanged      MDM  Pt presents for evaluation of a new onset facial droop and aphasia.  She has no focal extremity weakness.  Note stable VS.  S/s onset at roughly 1800.  Plan stroke evaluation including basic labs, CT of head, and neurology consult.  She is within the time window to receive thrombolytics.    Dr. Pearlean Brownie was at the bedside shortly after her arrival.  I discussed her CT findings with the on-call radiologist.  No acute infarctions or bleeding noted.  She does have some chronic ischemic changes.  Pt's speech has improved between her arrival and now.  Secondary to rapidly improving s/s, she is not a candidate for tPA.  Plan admit for monitoring and further evaluation.       Tobin Chad, MD 05/21/12 2311

## 2012-05-21 NOTE — ED Notes (Signed)
Pt arrived from home via [facility] c/o stoke like symptoms. Difficulty speaking, and facial droop. Last seen normal 6:00pm

## 2012-05-21 NOTE — Consult Note (Signed)
Stroke Consult    Chief Complaint:  Speech difficulty and right facial droop HPI: Katelyn [name] is an 78 y.o. female   who developed sudden onset of expressive speech difficulties with right facial droop at 1800 hours. Code stroke was called by EMS en route and patient arrived and symptoms started improving. By the time I saw her she had mild dysarthria and right facial droop and she could easily understood and NIH stroke scale was 2. She denies any headache, eczema to weakness, numbness. She has past history of stroke 2 years ago but according to the husband made full neurological recovery without any residual deficits. She does have history of atrial fibrillation diagnosed 2 years ago and was advised to take warfarin but the patient refused. She has been taking Plavix daily instead. Patient's husband who is present at the bedside who provided the history as well as as per spoke to patient's daughter over the phone to corroborate the story .  LSN: [id number] on 05/21/12 tPA Given: No: Rapid improvement  Past Medical History  Diagnosis Date  . Hypertension   . Thyroid disease   . Arthritis   . Stroke     Past Surgical History  Procedure Date  . Hernia repair     No family history on file. Social History:  reports that she has never smoked. She does not have any smokeless tobacco history on file. She reports that she does not drink alcohol or use illicit drugs.  Allergies: No Known Allergies   (Not in a hospital admission)  ROS: Negative for chest pain, palpitation, shortness of breath, diarrhea, fever. Positive for speech difficulties, facial weakness, aphasia.  Physical Examination: Blood pressure 163/85, pulse 68, temperature 97.9 F (36.6 C), temperature source Oral, resp. rate 18, SpO2 98.00%.   Awake alert frail cachectic elderly Caucasian lady. Afebrile. Head is nontraumatic. Neck is supple without bruit. Hearing is normal. Cardiac exam no murmur or gallop but irregularly  irregular heart sounds. Lungs are clear to auscultation. Distal pulses are well felt.   Neurologic Examination:  Awake alert oriented to time place and person. Follows two-step commands. Slightly diminished attention registration and recall. Mild dysarthria and occasional word hesitancy but speech can be easily understood. Able to comprehend name and repeat well. Fundi were not visualized. Vision acuity and fields appear normal. There is mild right lower facial weakness. Tongue is midline. Palatal movements normal. Motor system exam reveals no upper or lower extremity drift. Symmetric and equal strength in all 4 extremities. Fine finger movements are symmetric. Deep tendon reflexes are 1+ symmetric. Plantars are downgoing. Finger-to-nose and it will coordination are adequate. Gait was not tested. Touch and pinprick sensation are preserved bilaterally.  NIH stroke scale 2. Modified Rankin scale 2.  Results for orders placed during the hospital encounter of 05/21/12 (from the past 48 hour(s))  PROTIME-INR     Status: Normal   Collection Time   05/21/12  8:00 PM      Component Value Range Comment   Prothrombin Time 14.3  11.6 - 15.2 seconds    INR 1.13  0.00 - 1.49   APTT     Status: Abnormal   Collection Time   05/21/12  8:00 PM      Component Value Range Comment   aPTT 41 (*) 24 - 37 seconds   CBC     Status: Abnormal   Collection Time   05/21/12  8:00 PM      Component Value Range Comment  WBC 5.1  4.0 - 10.5 K/uL    RBC 4.13  3.87 - 5.11 MIL/uL    Hemoglobin 12.7  12.0 - 15.0 g/dL    HCT 11.9  14.7 - 82.9 %    MCV 89.6  78.0 - 100.0 fL    MCH 30.8  26.0 - 34.0 pg    MCHC 34.3  30.0 - 36.0 g/dL    RDW 56.2  13.0 - 86.5 %    Platelets 148 (*) 150 - 400 K/uL   DIFFERENTIAL     Status: Normal   Collection Time   05/21/12  8:00 PM      Component Value Range Comment   Neutrophils Relative 68  43 - 77 %    Neutro Abs 3.5  1.7 - 7.7 K/uL    Lymphocytes Relative 22  12 - 46 %    Lymphs  Abs 1.1  0.7 - 4.0 K/uL    Monocytes Relative 9  3 - 12 %    Monocytes Absolute 0.4  0.1 - 1.0 K/uL    Eosinophils Relative 1  0 - 5 %    Eosinophils Absolute 0.1  0.0 - 0.7 K/uL    Basophils Relative 0  0 - 1 %    Basophils Absolute 0.0  0.0 - 0.1 K/uL    Ct Head Wo Contrast  05/21/2012  *RADIOLOGY REPORT*  Clinical Data: Code stroke, right-sided facial droop and slurred speech  CT HEAD WITHOUT CONTRAST  Technique:  Contiguous axial images were obtained from the base of the skull through the vertex without contrast.  Comparison: None.  Findings:  Grossly unchanged age advanced atrophy with corresponding mild ex vacuo dilatation of the ventricular system.  Grossly unchanged scattered periventricular hypodensities, left greater than right (image 15), compatible microvascular ischemic disease.  Right basal ganglia calcifications.  Given background parenchymal abnormalities, there is no CT evidence of acute large territory infarct.  No intraparenchymal or extra-axial mass or hemorrhage. Normal size and configuration of the ventricles and basilar cisterns.  No midline shift.  Limited visualization of the paranasal sinuses and mastoid air cells are normal.  Regional soft tissues are normal.  No displaced calvarial fracture.  IMPRESSION: Unchanged atrophy and microvascular ischemic disease without acute intracranial process.  Above findings discussed with Dr. Lorenso Courier at [phone].   Original Report Authenticated By: Waynard Reeds, M.D.     Assessment: 78 y.o. female with sudden onset of expressive speech difficulties and mild right facial weakness secondary to small left brain MCA branch infarct etiology likely cardioembolic from atrial fibrillation. Patient has presented within 3 hours but we will not give TPA due to 2 rapid and near total improvement in her symptoms. In case she neurologically terminates over the next one hour and is still within the TPA window may reconsider. Admit to medical team for stroke  workup and monitoring. Stroke Risk Factors - atrial fibrillation, HT, Hyperlipidimia  Plan: 1. HgbA1c, fasting lipid panel 2. MRI, MRA  of the brain without contrast 3. PT consult, OT consult, Speech consult 4. Echocardiogram 5. Carotid dopplers 6. Prophylactic therapy-Antiplatelet med: Aspirin 81 mg plus plavix 75 mg daily for AFIB as patient refusing warfarin 7. Risk factor modification 8. Telemetry monitoring 9. Frequent neuro checks  D/W patient, husband, and daughter and ER physician and answered questions.  Delia Heady, MD Medical Director [facility] Pager: [phone] 05/21/2012 8:54 PM    05/21/2012, 8:28 PM

## 2012-05-21 NOTE — Progress Notes (Signed)
Patient had some episodes of bradycardia in the ER with heart rates into the low 30s however she quickly rebounded to 50s. Patient asymptomatic. Has atrial fibrillation.

## 2012-05-22 ENCOUNTER — Observation Stay (HOSPITAL_COMMUNITY): Payer: Medicare Other

## 2012-05-22 ENCOUNTER — Encounter (HOSPITAL_COMMUNITY): Payer: Self-pay | Admitting: *Deleted

## 2012-05-22 DIAGNOSIS — I10 Essential (primary) hypertension: Secondary | ICD-10-CM

## 2012-05-22 DIAGNOSIS — G459 Transient cerebral ischemic attack, unspecified: Secondary | ICD-10-CM

## 2012-05-22 DIAGNOSIS — E039 Hypothyroidism, unspecified: Secondary | ICD-10-CM

## 2012-05-22 DIAGNOSIS — I369 Nonrheumatic tricuspid valve disorder, unspecified: Secondary | ICD-10-CM

## 2012-05-22 LAB — LIPID PANEL
HDL: 67 mg/dL (ref >39)
LDL Cholesterol: 50 mg/dL (ref 0–99)
Total CHOL/HDL Ratio: 1.9 RATIO
Triglycerides: 54 mg/dL (ref <150)
VLDL: 11 mg/dL (ref 0–40)

## 2012-05-22 LAB — HEMOGLOBIN A1C
Hgb A1c MFr Bld: 5.7 % — ABNORMAL HIGH (ref <5.7)
Mean Plasma Glucose: 117 mg/dL — ABNORMAL HIGH (ref <117)

## 2012-05-22 LAB — GLUCOSE, CAPILLARY
Glucose-Capillary: 82 mg/dL (ref 70–99)
Glucose-Capillary: 74 mg/dL (ref 70–99)

## 2012-05-22 LAB — RAPID URINE DRUG SCREEN, HOSP PERFORMED: Opiates: NOT DETECTED

## 2012-05-22 LAB — CREATININE, SERUM
Creatinine, Ser: 0.68 mg/dL (ref 0.50–1.10)
GFR calc Af Amer: 88 mL/min — ABNORMAL LOW (ref >90)
GFR calc non Af Amer: 76 mL/min — ABNORMAL LOW (ref >90)

## 2012-05-22 LAB — TSH: TSH: 0.499 u[IU]/mL (ref 0.350–4.500)

## 2012-05-22 MED ORDER — ASPIRIN 81 MG PO CHEW: 81.0000 mg | CHEWABLE_TABLET | Freq: Every day | ORAL | Status: DC

## 2012-05-22 MED ORDER — SIMVASTATIN 40 MG PO TABS
40.0000 mg | ORAL_TABLET | Freq: Every day | ORAL | Status: DC
  Filled 2012-05-22: qty 1

## 2012-05-22 MED ORDER — SODIUM CHLORIDE 0.9 % IV SOLN
INTRAVENOUS | Status: DC
  Administered 2012-05-22: 12:00:00 via INTRAVENOUS

## 2012-05-22 MED ORDER — INFLUENZA VIRUS VACC SPLIT PF IM SUSP: 0.5000 mL | INTRAMUSCULAR | Status: DC

## 2012-05-22 MED ORDER — ASPIRIN 81 MG PO CHEW
81.0000 mg | CHEWABLE_TABLET | Freq: Every day | ORAL | Status: DC
  Administered 2012-05-22: 81 mg via ORAL
  Filled 2012-05-22: qty 1

## 2012-05-22 MED ORDER — ENOXAPARIN SODIUM 40 MG/0.4ML ~~LOC~~ SOLN
40.0000 mg | SUBCUTANEOUS | Status: DC
  Administered 2012-05-22: 40 mg via SUBCUTANEOUS
  Filled 2012-05-22: qty 0.4

## 2012-05-22 MED ORDER — ENSURE COMPLETE PO LIQD
237.0000 mL | Freq: Two times a day (BID) | ORAL | Status: DC
  Administered 2012-05-22: 237 mL via ORAL

## 2012-05-22 MED ORDER — BISOPROLOL-HYDROCHLOROTHIAZIDE 5-6.25 MG PO TABS
1.0000 | ORAL_TABLET | Freq: Every morning | ORAL | Status: DC
  Administered 2012-05-22: 1 via ORAL
  Filled 2012-05-22: qty 1

## 2012-05-22 MED ORDER — LEVOTHYROXINE SODIUM 75 MCG PO TABS
75.0000 ug | ORAL_TABLET | Freq: Every day | ORAL | Status: DC
  Administered 2012-05-22: 75 ug via ORAL
  Filled 2012-05-22: qty 1

## 2012-05-22 MED ORDER — CLOPIDOGREL BISULFATE 75 MG PO TABS
75.0000 mg | ORAL_TABLET | Freq: Every day | ORAL | Status: DC
  Administered 2012-05-22: 75 mg via ORAL
  Filled 2012-05-22: qty 1

## 2012-05-22 NOTE — Progress Notes (Signed)
Utilization review completed. Aribelle Mccosh, RN, BSN. 

## 2012-05-22 NOTE — Progress Notes (Signed)
Pt for discharge home today, IV D/C,  D/C instructions and Rx given with verbalized understanding.  Family at bedside to assist pt with discharge. Staff brought pt downstairs via wheelchair.  

## 2012-05-22 NOTE — Discharge Summary (Signed)
Physician Discharge Summary  Katelyn Katelyn [name]:[id number] DOB: 10-18-1921 DOA: 05/21/2012  PCP: Garlan Fillers, MD  Admit date: 05/21/2012 Discharge date: 05/22/2012  Recommendations for Outpatient Follow-up:  1. Patient will f/u with neurology in the office in 2 months 2. Patient requested cardiology appointment with Dr. Shirlee Latch for afib - arranged   Discharge Diagnoses:  Small focal cortical infarct in the precentral gyrus - probably SVD Afib with slow ventricular response Cerebral atrophy  Hypothyroidism HTN   Discharge Condition: neurologically intact - back to baseline   Diet recommendation: heart healthy   Filed Weights   05/22/12 0200 05/22/12 0441  Weight: 42.9 kg (94 lb 9.2 oz) 42.638 kg (94 lb)    History of present illness:  This is 78 year old Katelyn [name] was a home in that department at 5 PM she states she had a piece of paper hand and it fell. She bent over to pick up the paper up and found that she could not. On getting up she felt odd. Her speech became very slurred and incomprehensible. She reports no other localized weakness, she had some facial drooping. She was brought to the ER. By time patient was seen in the ER her symptoms were improving, and during my interview the symptoms had resolved. History provided by the patient. Patient has a history of atrial fibrillation, she declined to Coumadin. She continues to decline Coumadin.   Hospital Course:  1. Patient presented with inability to speak and RUE weakness that resolved completely by the time she arrived in the ED. W/u revealed a very small cortical stroke - not likely to be embolic or to explain all her symptoms prior to admission. She remained neurologically intact throughout her hospital stay. Carotid US did not reveal significant ICA stenoses. Dr. Pearlean Brownie from neurology recommended that we add aspirin to plavix for better secondary stroke prophylaxis. Patient was screened by PT/OT/ST and she was not found to have  any f/u needs 2. Afib with slow vetnrticular  rate - doubt symptomatic - advised patient to cont bisoprolol. She requested cardiology f/u and we did arrange a visit with Dr. Shirlee [facility] from [facility] 3. Hypothyroidism - we sent a repeat TSH - results pending at DC  Procedures:  MRI brain   Carotid dopplers  2D echo - results pending prior to DC  Consultations:  Neurology   Discharge Exam: Filed Vitals:   05/22/12 0200 05/22/12 0441 05/22/12 0600 05/22/12 1400  BP: 155/80 155/80 132/65 140/78  Pulse: 59 59 50 60  Temp: 97.8 F (36.6 C) 97.8 F (36.6 C) 97.7 F (36.5 C) 98 F (36.7 C)  TempSrc: Oral Oral Oral Oral  Resp: 18 18 18 18   Height: 5' (1.524 m) 5' (1.524 m)    Weight: 42.9 kg (94 lb 9.2 oz) 42.638 kg (94 lb)    SpO2: 100% 100% 97% 98%    General: axox3 Cardiovascular: RRR, no M, R,G Respiratory: ctab  Neuro - CN 2-12 intact, strength 5/5 all 4, sensation intact   Discharge Instructions  Discharge Orders    Future Appointments: Provider: Department: Dept Phone: Center:   06/18/2012 11:30 AM Laurey Morale, MD [facility] [phone] [facility]     Future Orders Please Complete By Expires   Diet - low sodium heart healthy      Increase activity slowly          Medication List     As of 05/22/2012  2:44 PM    TAKE these medications  aspirin 81 MG chewable tablet   Chew 1 tablet (81 mg total) by mouth daily.      bisoprolol-hydrochlorothiazide 5-6.25 MG per tablet   Commonly known as: ZIAC   Take 1 tablet by mouth every morning.      clopidogrel 75 MG tablet   Commonly known as: PLAVIX   Take 75 mg by mouth daily.      levothyroxine 75 MCG tablet   Commonly known as: SYNTHROID, LEVOTHROID   Take 75 mcg by mouth daily. Not on Sundays      lovastatin 40 MG tablet   Commonly known as: MEVACOR   Take 40 mg by mouth at bedtime.      Vitamin D (Ergocalciferol) 50000 UNITS Caps   Commonly known as: DRISDOL   Take 50,000 Units by  mouth every 7 (seven) days. On Saturdays           Follow-up Information    Schedule an appointment as soon as possible for a visit with Garlan Fillers, MD.   Contact information:   [address] [facility] [place] [address] [phone]       Follow up with Gates Rigg, MD. Schedule an appointment as soon as possible for a visit in 2 months.   Contact information:   [address], [address] [facility] [place] [address] [phone]       Follow up with Marca Ancona, MD. On 06/18/2012. (11:30 am)    Contact information:   [address] [place] [address] [phone]           The results of significant diagnostics from this hospitalization (including imaging, microbiology, ancillary and laboratory) are listed below for reference.    Significant Diagnostic Studies: Ct Head Wo Contrast  05/21/2012  *RADIOLOGY REPORT*  Clinical Data: Code stroke, right-sided facial droop and slurred speech  CT HEAD WITHOUT CONTRAST  Technique:  Contiguous axial images were obtained from the base of the skull through the vertex without contrast.  Comparison: None.  Findings:  Grossly unchanged age advanced atrophy with corresponding mild ex vacuo dilatation of the ventricular system.  Grossly unchanged scattered periventricular hypodensities, left greater than right (image 15), compatible microvascular ischemic disease.  Right basal ganglia calcifications.  Given background parenchymal abnormalities, there is no CT evidence of acute large territory infarct.  No intraparenchymal or extra-axial mass or hemorrhage. Normal size and configuration of the ventricles and basilar cisterns.  No midline shift.  Limited visualization of the paranasal sinuses and mastoid air cells are normal.  Regional soft tissues are normal.  No displaced calvarial fracture.  IMPRESSION: Unchanged atrophy and microvascular  ischemic disease without acute intracranial process.  Above findings discussed with Dr. Lorenso Courier at [phone].   Original Report Authenticated By: Waynard Reeds, M.D.    Mri Brain Without Contrast  05/22/2012  *RADIOLOGY REPORT*  Clinical Data:  Right-sided facial droop.  Slurred speech.  MRI HEAD WITHOUT CONTRAST  Technique:  Multiplanar, multiecho pulse sequences of the brain and surrounding structures were obtained according to standard protocol without intravenous contrast.  Comparison:  CT scan of of the brain of 05/21/2012, an MRI of the brain of 06/27/2005.  Findings:  Gray-white matter differentiation is normal.  Partial empty sella remains unchanged.  Clival signal is intact.  Cerebellar tonsils are above the level of the foramen magnum.  The odontoid process, the predental space and the prevertebral soft tissues are within normal limits.  There is a small focal area of cortical based restricted diffusion involving the left precentral gyrus.   Axial FLAIR and T2-weighted images demonstrate multiple focal areas of signal hyperintensity involving the  subcortical white matter bifrontally, the centrum semi ovale, the corona radiata and the biparietal subcortical white matter.  There is diffuse prominence of the sulci overlying the cerebral convexities.  Ventricles are at the upper limits of normal.  Mastoids are clear.  The internal auditory canals are symmetrical in signal and morphology.  Flow voids are maintained in the major vessels at the cranial skull base.  There is moderate thickening  of the mucosa of the ethmoid air cells and maxillary sinuses.  The visualized orbital contents are grossly normal.  No  abnormal blood breakdown products seen on SPGR sequences.  IMPRESSION: 1.  A small focal cortical based ischemic infarct involving the left precentral gyrus. 2.  Nonspecific sub cortical white matter changes most likely representing sequela of chronic microvascular ischemia. 3.  Diffuse atrophy.  4.   Mild inflammatory mucosal thickening of the paranasal sinuses.   Original Report Authenticated By: Oneal Grout, M.D.    Mr Mra Head/brain Wo Cm  05/22/2012  *RADIOLOGY REPORT*  Clinical Data:  Right-sided facial droop, and slurred speech.  MRA HEAD WITHOUT CONTRAST  Technique: Angiographic images of the Circle of Willis were obtained using MRA technique without intravenous contrast.  Comparison: None.  Findings:   The internal carotid arteries in their  petrous ,cavernous and supraclinoid segments are widely patent.  Bilateral posterior communicating arteries are seen.  The middle cerebral arteries to the trifurcation branches bilaterally, and the anterior cerebral arteries to the A2 segments demonstrate wide patency and flow signal.  The anterior communicating artery is patent.  Vertebrobasilar junctions patent with a left posterior inferior cerebellar artery flow signal demonstrated.  The basilar artery, the posterior cerebral arteries, and superior cerebellar arteries demonstrate adequate caliber and flow signal. The right vertebrobasilar junction is hypoplastic, as are  the anterior inferior cerebellar arteries bilaterally.  Impression. 1.  No aneurysms, occlusions or dissections seen . 2.  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   Original Report Authenticated By: Oneal Grout, M.D.     Microbiology: No results found for this or any previous visit (from the past 240 hour(s)).   Labs: Basic Metabolic Panel:  Lab 05/21/12 3086 05/21/12 2000  NA -- 136  K -- 3.3*  CL -- 99  CO2 -- 27  GLUCOSE -- 97  BUN -- 17  CREATININE 0.68 0.77  CALCIUM -- 9.9  MG -- --  PHOS -- --   Liver Function Tests:  Lab 05/21/12 2000  AST 30  ALT 25  ALKPHOS 63  BILITOT 0.7  PROT 6.8  ALBUMIN 3.9   No results found for this basename: LIPASE:5,AMYLASE:5 in the last 168 hours No results found for this basename: AMMONIA:5 in the last 168 hours CBC:  Lab  05/21/12 2320 05/21/12 2000  WBC 4.0 5.1  NEUTROABS -- 3.5  HGB 12.7 12.7  HCT 37.6 37.0  MCV 89.3 89.6  PLT 145* 148*   Cardiac Enzymes:  Lab 05/21/12 2000  CKTOTAL --  CKMB --  CKMBINDEX --  TROPONINI <0.30   BNP: BNP (last 3 results) No results found for this basename: PROBNP:3 in the last 8760 hours CBG:  Lab 05/22/12 1131 05/22/12 0640  GLUCAP 74 82    Time coordinating discharge: 40 minutes  Signed:  Brileigh Sevcik  [facility] 05/22/2012, 2:44 PM

## 2012-05-22 NOTE — Progress Notes (Signed)
Stroke Team Progress Note  HISTORY Katelyn [name] is an 78 y.o. female who developed sudden onset of expressive speech difficulties with right facial droop at 1800 hours 05/21/2012. Code stroke was called by EMS en route and patient arrived and symptoms started improving. By the time I saw her she had mild dysarthria and right facial droop and she could easily understood and NIH stroke scale was 2. She denies any headache, eczema to weakness, numbness. She has past history of stroke 2 years ago but according to the husband made full neurological recovery without any residual deficits. She does have history of atrial fibrillation diagnosed 2 years ago and was advised to take warfarin but the patient refused. She has been taking Plavix daily instead. Patient's husband who is present at the bedside who provided the history as well as as per spoke to patient's daughter over the phone to corroborate the story .  Patient was not a TPA candidate secondary to rapid improvement. She was admitted for further evaluation and treatment.   SUBJECTIVE Her daughter and husband is at the bedside.  Overall she feels her condition is rapidly improving. She continues to do much better. Patient is currently off the floor.  OBJECTIVE Most recent Vital Signs: Filed Vitals:   05/21/12 2309 05/22/12 0200 05/22/12 0441 05/22/12 0600  BP: 159/78 155/80 155/80 132/65  Pulse: 52 59 59 50  Temp: 97.3 F (36.3 C) 97.8 F (36.6 C) 97.8 F (36.6 C) 97.7 F (36.5 C)  TempSrc: Oral Oral Oral Oral  Resp: 18 18 18 18   Height:  5' (1.524 m) 5' (1.524 m)   Weight:  42.9 kg (94 lb 9.2 oz) 42.638 kg (94 lb)   SpO2: 98% 100% 100% 97%   CBG (last 3)   Basename 05/22/12 0640  GLUCAP 82   Intake/Output from previous day: 10/09 0701 - 10/10 0700 In: -  Out: 250 [Urine:250]  IV Fluid Intake:     MEDICATIONS    . aspirin  81 mg Oral Daily  . bisoprolol-hydrochlorothiazide  1 tablet Oral q morning - 10a  . clopidogrel  75  mg Oral Q breakfast  . enoxaparin  40 mg Subcutaneous Q24H  . influenza  inactive virus vaccine  0.5 mL Intramuscular Tomorrow-1000  . levothyroxine  75 mcg Oral Daily  . simvastatin  40 mg Oral q1800   PRN:    Diet:  Cardiac thin liquids Activity:  Bathroom privileges with assistance DVT Prophylaxis:  Lovenox 40 mg sq daily   CLINICALLY SIGNIFICANT STUDIES Basic Metabolic Panel:  Lab 05/21/12 1610 05/21/12 2000  NA -- 136  K -- 3.3*  CL -- 99  CO2 -- 27  GLUCOSE -- 97  BUN -- 17  CREATININE 0.68 0.77  CALCIUM -- 9.9  MG -- --  PHOS -- --   Liver Function Tests:  Lab 05/21/12 2000  AST 30  ALT 25  ALKPHOS 63  BILITOT 0.7  PROT 6.8  ALBUMIN 3.9   CBC:  Lab 05/21/12 2320 05/21/12 2000  WBC 4.0 5.1  NEUTROABS -- 3.5  HGB 12.7 12.7  HCT 37.6 37.0  MCV 89.3 89.6  PLT 145* 148*   Coagulation:  Lab 05/21/12 2000  LABPROT 14.3  INR 1.13   Cardiac Enzymes:  Lab 05/21/12 2000  CKTOTAL --  CKMB --  CKMBINDEX --  TROPONINI <0.30   Urinalysis: No results found for this basename: COLORURINE:2,APPERANCEUR:2,LABSPEC:2,PHURINE:2,GLUCOSEU:2,HGBUR:2,BILIRUBINUR:2,KETONESUR:2,PROTEINUR:2,UROBILINOGEN:2,NITRITE:2,LEUKOCYTESUR:2 in the last 168 hours Lipid Panel    Component Value Date/Time  CHOL 128 05/22/2012 0605   TRIG 54 05/22/2012 0605   HDL 67 05/22/2012 0605   CHOLHDL 1.9 05/22/2012 0605   VLDL 11 05/22/2012 0605   LDLCALC 50 05/22/2012 0605   HgbA1C  No results found for this basename: HGBA1C    Urine Drug Screen:     Component Value Date/Time   LABOPIA NONE DETECTED 05/22/2012 0405   COCAINSCRNUR NONE DETECTED 05/22/2012 0405   LABBENZ NONE DETECTED 05/22/2012 0405   AMPHETMU NONE DETECTED 05/22/2012 0405   THCU NONE DETECTED 05/22/2012 0405   LABBARB NONE DETECTED 05/22/2012 0405    Alcohol Level: No results found for this basename: ETH:2 in the last 168 hours  CT of the brain  05/21/2012  Unchanged atrophy and microvascular ischemic disease  without acute intracranial process.   MRI of the brain    MRA of the brain    2D Echocardiogram    Carotid Doppler    CXR    EKG  atrial fibrillation, rate 76.   Therapy Recommendations PT - ; OT - ; ST -   Physical Exam  - pt off floor  ASSESSMENT Ms. Katelyn [name] is a 78 y.o. female presenting with speech difficulty and right facial weakness. Imaging pending.  Work up underway. On clopidogrel 75 mg orally every day prior to admission. Now on aspirin 81 mg orally every day and clopidogrel 75 mg orally every day for secondary stroke prevention.   Hypertension Previous stroke Bradycardia during the night, 30s  Hospital day # 1  TREATMENT/PLAN  Continue aspirin 81 mg orally every day and clopidogrel 75 mg orally every day for secondary stroke prevention given dx of atrial fibrillation.  Will follow up tomorrow  Annie Main, MSN, RN, ANVP-BC, ANP-BC, GNP-BC [facility] Pager: [phone] 05/22/2012 9:13 AM  Scribe for Dr. Delia Heady, Stroke Center Medical Director, who has personally reviewed chart, pertinent data, examined the patient and developed the plan of care. Pager:  [phone]

## 2012-05-22 NOTE — Progress Notes (Signed)
VASCULAR LAB PRELIMINARY  PRELIMINARY  PRELIMINARY  PRELIMINARY  Carotid duplex  completed.    Preliminary report:  Bilateral:  No evidence of hemodynamically significant internal carotid artery stenosis.   Vertebral artery flow is antegrade.      Katelyn [name], RVT 05/22/2012, 12:30 PM

## 2012-05-22 NOTE — Progress Notes (Signed)
  Echocardiogram 2D Echocardiogram has been performed.  Katelyn [name] FRANCES 05/22/2012, 10:55 AM

## 2012-05-22 NOTE — Evaluation (Signed)
Clinical/Bedside Swallow Evaluation Patient Details  Name: Katelyn [name] MRN: [id number] Date of Birth: 1922/06/20  Today's Date: 05/22/2012 Time: 4098-1191 SLP Time Calculation (min): 16 min  Past Medical History:  Past Medical History  Diagnosis Date  . Hypertension   . Thyroid disease   . Arthritis   . Stroke    Past Surgical History:  Past Surgical History  Procedure Date  . Hernia repair    HPI:  78 y/o female with h/x of HTN, stroke, arthritis. Pt. admitted to [facility] on 05/21/12 with symptoms suggestive of TIA. CT showed unchanged atrophy and microvascular ischemic disease without acute intracranial process. MRI order for later this afternoon.    Assessment / Plan / Recommendation Clinical Impression  Pt. seen for bedside swallow evaluation, she is alert, pleasant, and cooperative at bedside. Oral phase appeared [facility] with all consistencies. Minimal throat clear observed with thin liquids however, not concerned this is related to ability to protect airway as throat clears were minimal and subtle. SLP recommends regular diet with thin liquids. Pt. educated on safe swallow strategies (small bites and sips) and is agreeable to recommended diet. At this time, no ST is recommended. Please reorder if ST services are warranted.      Aspiration Risk  Mild    Diet Recommendation Regular;Thin liquid   Liquid Administration via: Cup Medication Administration: Whole meds with liquid Supervision: Patient able to self feed Compensations: Slow rate;Small sips/bites Postural Changes and/or Swallow Maneuvers: Seated upright 90 degrees    Other  Recommendations Oral Care Recommendations: Oral care BID   Follow Up Recommendations  None    Frequency and Duration            SLP Swallow Goals     Swallow Study Prior Functional Status       General Date of Onset: 05/21/12 HPI: 78 y/o female with h/x of HTN, stroke, arthritis. Pt. admitted to [facility] on 05/21/12 with symptoms suggestive of  TIA. CT showed unchanged atrophy and microvascular ischemic disease without acute intracranial process. MRI order for later this afternoon.  Type of Study: Bedside swallow evaluation Diet Prior to this Study: NPO Temperature Spikes Noted: No Respiratory Status: Room air History of Recent Intubation: No Behavior/Cognition: Alert;Pleasant mood;Cooperative Oral Cavity - Dentition: Adequate natural dentition Self-Feeding Abilities: Able to feed self Patient Positioning: Upright in chair Baseline Vocal Quality: Clear Volitional Cough: Strong Volitional Swallow: Able to elicit    Oral/Motor/Sensory Function Overall Oral Motor/Sensory Function: Impaired Labial ROM: Within Functional Limits Labial Symmetry: Within Functional Limits Labial Strength: Reduced Labial Sensation: Within Functional Limits Lingual ROM: Within Functional Limits Lingual Symmetry: Within Functional Limits Lingual Strength: Reduced Lingual Sensation: Within Functional Limits Facial ROM: Within Functional Limits Facial Symmetry: Within Functional Limits Facial Strength: Reduced Facial Sensation: Within Functional Limits Velum: Within Functional Limits Mandible: Within Functional Limits   Ice Chips Ice chips: Within functional limits Presentation: Spoon;Self Fed   Thin Liquid Thin Liquid: Impaired Presentation: Cup;Straw;Self Fed Pharyngeal  Phase Impairments: Throat Clearing - Immediate    Nectar Thick Nectar Thick Liquid: Not tested   Honey Thick Honey Thick Liquid: Not tested   Puree Puree: Within functional limits Presentation: Self Fed;Spoon   Solid   GO Functional Assessment Tool Used: clinical judgement Functional Limitations: Swallowing Swallow Current Status (Y7829): 0 percent impaired, limited or restricted Swallow Goal Status (F6213): 0 percent impaired, limited or restricted Swallow Discharge Status [phone]): 0 percent impaired, limited or restricted  Solid: Within functional limits Presentation:  Self Fed  Theotis Burrow 05/22/2012,10:25 AM

## 2012-05-22 NOTE — Progress Notes (Signed)
INITIAL ADULT NUTRITION ASSESSMENT Date: 05/22/2012   Time: 11:07 AM Reason for Assessment: poor po  INTERVENTION: Discussed importance of nutrition with pt/daughter. Reviewed protein sources and gave examples of liquid supplements to trial at home. Ensure Complete po BID, each supplement provides 350 kcal and 13 grams of protein.   DOCUMENTATION CODES Per approved criteria  -Severe malnutrition in the context of chronic illness -Underweight    ASSESSMENT: Female 78 y.o.  Dx: Slurred Speech  Hx:  Past Medical History  Diagnosis Date  . Hypertension   . Thyroid disease   . Arthritis   . Stroke    Past Surgical History  Procedure Date  . Hernia repair     Related Meds:  Scheduled Meds:   . aspirin  81 mg Oral Daily  . bisoprolol-hydrochlorothiazide  1 tablet Oral q morning - 10a  . clopidogrel  75 mg Oral Q breakfast  . enoxaparin  40 mg Subcutaneous Q24H  . influenza  inactive virus vaccine  0.5 mL Intramuscular Tomorrow-1000  . levothyroxine  75 mcg Oral Daily  . simvastatin  40 mg Oral q1800   Continuous Infusions:   . sodium chloride     Ht: 5' (152.4 cm)  Wt: 94 lb (42.638 kg)  Ideal Wt: 45.4 kg % Ideal Wt: 94%  Usual Wt: 102 lbs  Wt Readings from Last 10 Encounters:  05/22/12 94 lb (42.638 kg)   % Usual Wt: 92%  Body mass index is 18.36 kg/(m^2). Underweight  Food/Nutrition Related Hx: Pt reports her clothes are loose on her.   Labs:  CMP     Component Value Date/Time   NA 136 05/21/2012 2000   K 3.3* 05/21/2012 2000   CL 99 05/21/2012 2000   CO2 27 05/21/2012 2000   GLUCOSE 97 05/21/2012 2000   BUN 17 05/21/2012 2000   CREATININE 0.68 05/21/2012 2320   CALCIUM 9.9 05/21/2012 2000   PROT 6.8 05/21/2012 2000   ALBUMIN 3.9 05/21/2012 2000   AST 30 05/21/2012 2000   ALT 25 05/21/2012 2000   ALKPHOS 63 05/21/2012 2000   BILITOT 0.7 05/21/2012 2000   GFRNONAA 76* 05/21/2012 2320   GFRAA 88* 05/21/2012 2320   CBG (last 3)   Basename 05/22/12 1131  05/22/12 0640  GLUCAP 74 82    Intake/Output Summary (Last 24 hours) at 05/22/12 1338 Last data filed at 05/21/12 2155  Gross per 24 hour  Intake      0 ml  Output    250 ml  Net   -250 ml   Last BM: 10/9  Diet Order: Heart Healthy  Supplements/Tube Feeding: none  IVF:    sodium chloride   Pt admitted with stroke like symptoms which improved quickly. Work up under way.  Pt reports that her po intake has been less recently but no reason why. Per ED records from 12/2011 pt with new pulmonary nodule which was recommended for follow up as outpatient.  Pt meets criteria for severe malnutrition in the context of suspected chronic illness as evidenced by 8% weight loss in 3 months and severe wasting at clavicles.   Estimated Nutritional Needs:   Kcal:  1200-1400 Protein:  60-70 grams Fluid:  >1.3 L/day  NUTRITION DIAGNOSIS: -Malnutrition r/t suspected chronic illness AEB 8% weight loss in 3 months and severe wasting at clavicles.  MONITORING/EVALUATION(Goals): Goal: Pt to meet >/= 90% of their estimated nutrition needs. Monitor: PO intake, weight, supplement acceptance  EDUCATION NEEDS: -Education needs addressed   Kendell Bane  RD, LDN, CNSC [phone] Pager [phone] After Hours Pager  05/22/2012, 11:07 AM

## 2012-05-23 ENCOUNTER — Telehealth: Payer: Self-pay | Admitting: Internal Medicine

## 2012-05-26 LAB — GLUCOSE, CAPILLARY: Glucose-Capillary: 134 mg/dL — ABNORMAL HIGH (ref 70–99)

## 2012-06-18 ENCOUNTER — Telehealth: Payer: Self-pay | Admitting: Cardiology

## 2012-06-18 ENCOUNTER — Ambulatory Visit (INDEPENDENT_AMBULATORY_CARE_PROVIDER_SITE_OTHER): Payer: Medicare Other | Admitting: Cardiology

## 2012-06-18 ENCOUNTER — Institutional Professional Consult (permissible substitution): Payer: Medicare Other | Admitting: Cardiology

## 2012-06-18 ENCOUNTER — Encounter: Payer: Self-pay | Admitting: Cardiology

## 2012-06-18 VITALS — BP 162/82 | HR 53 | Ht 60.0 in | Wt 100.0 lb

## 2012-06-18 DIAGNOSIS — I10 Essential (primary) hypertension: Secondary | ICD-10-CM

## 2012-06-18 DIAGNOSIS — G459 Transient cerebral ischemic attack, unspecified: Secondary | ICD-10-CM

## 2012-06-18 DIAGNOSIS — I4891 Unspecified atrial fibrillation: Secondary | ICD-10-CM

## 2012-06-18 MED ORDER — APIXABAN 2.5 MG PO TABS: 2.5000 mg | ORAL_TABLET | Freq: Two times a day (BID) | ORAL | Status: DC

## 2012-06-18 NOTE — Telephone Encounter (Signed)
F/U   Was seen today was told to call back with additional information.   [name] - Work # [phone] . Home # [phone].

## 2012-06-18 NOTE — Progress Notes (Signed)
Patient ID: Katelyn [name], female   DOB: 11/23/1921, 78 y.o.   MRN: [id number] PCP: Dr. Eloise Harman  78 yo with history of atrial fibrillation and recent CVA presents for cardiology evaluation.  She was first noted to have atrial fibrillation in 2011.  At that time, she had a CVA.  She refused coumadin because her husband had been on coumadin and had an intracerebral hemorrhage.  She was put on Plavix.  She was then admitted in 10/13 with a right facial droop and was found to have a small left MCA branch territory infarction.  No tPA given due to rapid recovery of facial droop.  She was again noted to be in atrial fibrillation and again refused coumadin.  No significant stenosis on carotid dopplers.  EF normal on echo.  BP is high today but she reports a significant white coat component.  She had an ambulatory home monitor a few weeks ago that actually showed fairly well controllled blood pressure.  She is fairly stable on her feet normally but has had about 4 falls in the last 2 years .   ECG: atrial fibrillation at 53, right axis, nonspecific ST changes.   Labs (10/13): creatinine 0.68, LDL 50, HDL 67  PMH: 1. Atrial fibrillation: First diagnosed about 2 years ago.  Has refused coumadin because husband had intracerebral hemorrhage on coumadin.  Echo (10/13) with EF 50-55%, biatrial enlargement, mild MR, normal RV size and systolic fucntion.  2. CVA in 2011 and again in 10/13.  Carotid dopplers (10/13) with no signficant stenosis.  3. HTN 4. Hypothyroidism  SH: Married, lives with her husband in [place].  No smoking.  Has 1 daughter living in [place].   FH: No premature CAD.   ROS: All systems reviewed and negative except as per HPI.   Current Outpatient Prescriptions  Medication Sig Dispense Refill  . bisoprolol-hydrochlorothiazide (ZIAC) 5-6.25 MG per tablet Take 1 tablet by mouth every morning.       Marland Kitchen levothyroxine (SYNTHROID, LEVOTHROID) 75 MCG tablet Take 75 mcg by mouth daily. Not on  Sundays      . lovastatin (MEVACOR) 40 MG tablet Take 40 mg by mouth at bedtime.      . Vitamin D, Ergocalciferol, (DRISDOL) 50000 UNITS CAPS Take 50,000 Units by mouth every 7 (seven) days. On Saturdays      . apixaban (ELIQUIS) 2.5 MG TABS tablet Take 1 tablet (2.5 mg total) by mouth 2 (two) times daily.  60 tablet  11  . [DISCONTINUED] apixaban (ELIQUIS) 2.5 MG TABS tablet Take 1 tablet (2.5 mg total) by mouth 2 (two) times daily.  60 tablet  3   BP 162/82  Pulse 53  Ht 5' (1.524 m)  Wt 100 lb (45.36 kg)  BMI 19.53 kg/m2 General: NAD Neck: No JVD, no thyromegaly or thyroid nodule.  Lungs: Clear to auscultation bilaterally with normal respiratory effort. CV: Nondisplaced PMI.  Heart irregular S1/S2, no S3/S4, no murmur.  No peripheral edema.  Bilateral lower leg varicosities.  No carotid bruit.  Normal pedal pulses.  Abdomen: Soft, nontender, no hepatosplenomegaly, no distention.  Skin: Intact without lesions or rashes.  Neurologic: Alert and oriented x 3.  Psych: Normal affect. Extremities: No clubbing or cyanosis.  HEENT: Normal.   Assessment/Plan: 1. CVA: Patient has had 2 CVAs => in 2011 and now in 10/13.  No evidence for carotid stenosis.  CVA was likely related to atrial fibrillation.  2. Atrial fibrillation:  Patient is in well-controlled atrial fibrillation today.  Continue current bisoprolol dose. Minimal symptoms from the atrial fibrillation iself. She is now on aspirin and Plavix but has refused coumadin.  Risk of recurrent stroke is high given 2 prior strokes.  I would suggest that she be anticoagulated.  She is fairly stable with a fall about every 6 months or so.  She will need to start walking with a cane.  Stop ASA and Plavix.  I will then start her on apixaban 2.5 mg bid (lower dose given age and low weight).   I talked this over with the patient's daughter today.  3. HTN: BP high today but apparently was ok on ambulatory home BP monitor.   4. Followup in 1 month with  CBC.    Marca Ancona 06/19/2012

## 2012-06-18 NOTE — Telephone Encounter (Signed)
Dr Shirlee Latch spoke with pt's daughter. Pt will hold aspirin and plavix for 1 day, then begin eliquis 5mg  bid.

## 2012-06-18 NOTE — Patient Instructions (Addendum)
Ask your daughter to call Dr Shirlee Latch at [phone].   Your physician recommends that you schedule a follow-up appointment in:1 month with Dr Shirlee Latch.

## 2012-06-19 DIAGNOSIS — I4891 Unspecified atrial fibrillation: Secondary | ICD-10-CM | POA: Insufficient documentation

## 2012-07-07 ENCOUNTER — Institutional Professional Consult (permissible substitution): Payer: Medicare Other | Admitting: Cardiology

## 2012-07-23 ENCOUNTER — Ambulatory Visit: Payer: Medicare Other | Admitting: Cardiology

## 2012-07-23 ENCOUNTER — Encounter: Payer: Self-pay | Admitting: Cardiology

## 2012-07-23 ENCOUNTER — Ambulatory Visit (INDEPENDENT_AMBULATORY_CARE_PROVIDER_SITE_OTHER): Payer: Medicare Other | Admitting: Cardiology

## 2012-07-23 VITALS — BP 160/80 | HR 52 | Ht 60.0 in | Wt 104.0 lb

## 2012-07-23 DIAGNOSIS — G459 Transient cerebral ischemic attack, unspecified: Secondary | ICD-10-CM

## 2012-07-23 DIAGNOSIS — I10 Essential (primary) hypertension: Secondary | ICD-10-CM

## 2012-07-23 DIAGNOSIS — I4891 Unspecified atrial fibrillation: Secondary | ICD-10-CM

## 2012-07-23 LAB — CBC WITH DIFFERENTIAL/PLATELET
Basophils Absolute: 0 10*3/uL (ref 0.0–0.1)
HCT: 35.8 % — ABNORMAL LOW (ref 36.0–46.0)
Lymphs Abs: 1 10*3/uL (ref 0.7–4.0)
MCV: 91.2 fl (ref 78.0–100.0)
Monocytes Absolute: 0.4 10*3/uL (ref 0.1–1.0)
Monocytes Relative: 10.6 % (ref 3.0–12.0)
Neutrophils Relative %: 61.7 % (ref 43.0–77.0)
Platelets: 123 10*3/uL — ABNORMAL LOW (ref 150.0–400.0)
RDW: 14.1 % (ref 11.5–14.6)

## 2012-07-23 LAB — BASIC METABOLIC PANEL
Calcium: 9 mg/dL (ref 8.4–10.5)
GFR: 77.37 mL/min (ref >60.00)
Sodium: 137 mEq/L (ref 135–145)

## 2012-07-23 NOTE — Patient Instructions (Addendum)
Your physician recommends that you have  lab work today--BMET/CBC  Your physician wants you to follow-up in: 4 months with Dr Shirlee Latch. (April 2014).  You will receive a reminder letter in the mail two months in advance. If you don't receive a letter, please call our office to schedule the follow-up appointment.

## 2012-07-23 NOTE — Progress Notes (Signed)
Patient ID: Katelyn [name], female   DOB: 01-24-1922, 78 y.o.   MRN: [id number] PCP: Dr. Eloise Harman  78 yo with history of atrial fibrillation and recent CVA presents for cardiology evaluation.  She was first noted to have atrial fibrillation in 2011.  At that time, she had a CVA.  She refused coumadin because her husband had been on coumadin and had an intracerebral hemorrhage.  She was put on Plavix.  She was then admitted in 10/13 with a right facial droop and was found to have a small left MCA branch territory infarction.  No tPA given due to rapid recovery of facial droop.  She was again noted to be in atrial fibrillation and again refused coumadin.  No significant stenosis on carotid dopplers.  EF normal on echo.    After last appointment, she was started on lower dose apixaban.  She has tolerated this with no problems.  She remains in atrial fibrillation today.  She tires easily but denies exertional dyspnea or chest pain.  She has been steady on her feet recently with no falls.    ECG: atrial fibrillation at 54, nonspecific T wave changes.   Labs (10/13): creatinine 0.68, LDL 50, HDL 67  PMH: 1. Atrial fibrillation: First diagnosed about 2 years ago.  Has refused coumadin because husband had intracerebral hemorrhage on coumadin.  Echo (10/13) with EF 50-55%, biatrial enlargement, mild MR, normal RV size and systolic fucntion.  She is on apixaban.  2. CVA in 2011 and again in 10/13.  Carotid dopplers (10/13) with no signficant stenosis.  3. HTN 4. Hypothyroidism  SH: Married, lives with her husband in [place].  No smoking.  Has 1 daughter living in [place].   FH: No premature CAD.    Current Outpatient Prescriptions  Medication Sig Dispense Refill  . apixaban (ELIQUIS) 2.5 MG TABS tablet Take 1 tablet (2.5 mg total) by mouth 2 (two) times daily.  60 tablet  11  . bisoprolol-hydrochlorothiazide (ZIAC) 5-6.25 MG per tablet Take 1 tablet by mouth every morning.       Marland Kitchen levothyroxine  (SYNTHROID, LEVOTHROID) 75 MCG tablet Take 75 mcg by mouth daily. Not on Sundays      . lovastatin (MEVACOR) 40 MG tablet Take 40 mg by mouth at bedtime.      . Vitamin D, Ergocalciferol, (DRISDOL) 50000 UNITS CAPS Take 50,000 Units by mouth every 7 (seven) days. On Saturdays       BP 160/80  Pulse 52  Ht 5' (1.524 m)  Wt 104 lb (47.174 kg)  BMI 20.31 kg/m2 General: NAD Neck: No JVD, no thyromegaly or thyroid nodule.  Lungs: Clear to auscultation bilaterally with normal respiratory effort. CV: Nondisplaced PMI.  Heart irregular S1/S2, no S3/S4, no murmur.  No peripheral edema.  Bilateral lower leg varicosities.  No carotid bruit.  Normal pedal pulses.  Abdomen: Soft, nontender, no hepatosplenomegaly, no distention.  Neurologic: Alert and oriented x 3.  Psych: Normal affect. Extremities: No clubbing or cyanosis.   Assessment/Plan: 1. CVA: Patient has had 2 CVAs => in 2011 and now in 10/13.  No evidence for carotid stenosis.  CVA was likely related to atrial fibrillation.  2. Atrial fibrillation:  Patient is in well-controlled atrial fibrillation today.  Continue current bisoprolol dose. Minimal symptoms from the atrial fibrillation iself.  Risk of recurrent stroke is high without anticoagulation given 2 prior strokes. She is now on apixaban 2.5 mg bid (lower dose given age and low weight).  She is tolerating  this with no problems.  She is stable on her feet without falls.  Check CBC and BMET today.  3. HTN: BP high today but apparently was ok on ambulatory home BP monitor not long ago.   Marca Ancona 07/23/2012

## 2012-07-28 ENCOUNTER — Ambulatory Visit: Payer: Medicare Other | Admitting: Cardiology

## 2012-11-14 ENCOUNTER — Ambulatory Visit (INDEPENDENT_AMBULATORY_CARE_PROVIDER_SITE_OTHER): Payer: Medicare Other | Admitting: Cardiology

## 2012-11-14 ENCOUNTER — Encounter: Payer: Self-pay | Admitting: Cardiology

## 2012-11-14 VITALS — BP 128/66 | HR 64 | Ht 60.0 in | Wt 104.0 lb

## 2012-11-14 DIAGNOSIS — I10 Essential (primary) hypertension: Secondary | ICD-10-CM

## 2012-11-14 DIAGNOSIS — I4891 Unspecified atrial fibrillation: Secondary | ICD-10-CM

## 2012-11-14 LAB — CBC WITH DIFFERENTIAL/PLATELET
Basophils Relative: 0.7 % (ref 0.0–3.0)
Eosinophils Relative: 1.1 % (ref 0.0–5.0)
Hemoglobin: 12.5 g/dL (ref 12.0–15.0)
Lymphocytes Relative: 24.5 % (ref 12.0–46.0)
Monocytes Relative: 9.6 % (ref 3.0–12.0)
Neutro Abs: 2.5 10*3/uL (ref 1.4–7.7)
RBC: 4.05 Mil/uL (ref 3.87–5.11)

## 2012-11-14 LAB — BASIC METABOLIC PANEL
BUN: 18 mg/dL (ref 6–23)
CO2: 32 mEq/L (ref 19–32)
Chloride: 99 mEq/L (ref 96–112)
Creatinine, Ser: 0.7 mg/dL (ref 0.4–1.2)

## 2012-11-14 NOTE — Progress Notes (Signed)
Patient ID: Katelyn [name], female   DOB: 1921-10-21, 79 y.o.   MRN: [id number] PCP: Dr. Eloise Harman  79 yo with history of atrial fibrillation and recent CVA presents for cardiology evaluation.  She was first noted to have atrial fibrillation in 2011.  At that time, she had a CVA.  She refused coumadin because her husband had been on coumadin and had an intracerebral hemorrhage.  She was put on Plavix.  She was then admitted in 10/13 with a right facial droop and was found to have a small left MCA branch territory infarction.  No tPA given due to rapid recovery of facial droop.  She was again noted to be in atrial fibrillation and again refused coumadin.  No significant stenosis on carotid dopplers.  EF normal on echo.    She has been started on a lower dose apixaban.  She has tolerated this with no problems.  She remains in atrial fibrillation today.  She tires easily but denies exertional dyspnea or chest pain.  She has been steady on her feet recently with no falls.  She still drives.  No bleeding sequelae.   Labs (10/13): creatinine 0.68, LDL 50, HDL 67 Labs (12/13): HCT 35.8, K 4.2, creatinine 0.8  PMH: 1. Atrial fibrillation: First diagnosed about 2 years ago.  Has refused coumadin because husband had intracerebral hemorrhage on coumadin.  Echo (10/13) with EF 50-55%, biatrial enlargement, mild MR, normal RV size and systolic fucntion.  She is on apixaban.  2. CVA in 2011 and again in 10/13.  Carotid dopplers (10/13) with no signficant stenosis.  3. HTN 4. Hypothyroidism  SH: Married, lives with her husband in [place].  No smoking.  Has 1 daughter living in [place].   FH: No premature CAD.    Current Outpatient Prescriptions  Medication Sig Dispense Refill  . apixaban (ELIQUIS) 2.5 MG TABS tablet Take 1 tablet (2.5 mg total) by mouth 2 (two) times daily.  60 tablet  11  . bisoprolol-hydrochlorothiazide (ZIAC) 5-6.25 MG per tablet Take 1 tablet by mouth every morning.       Marland Kitchen  levothyroxine (SYNTHROID, LEVOTHROID) 75 MCG tablet Take 75 mcg by mouth daily. Not on Sundays      . lovastatin (MEVACOR) 40 MG tablet Take 40 mg by mouth at bedtime.      . Vitamin D, Ergocalciferol, (DRISDOL) 50000 UNITS CAPS Take 50,000 Units by mouth every 7 (seven) days. On Saturdays       No current facility-administered medications for this visit.   BP 128/66  Pulse 64  Ht 5' (1.524 m)  Wt 104 lb (47.174 kg)  BMI 20.31 kg/m2 General: NAD Neck: No JVD, no thyromegaly or thyroid nodule.  Lungs: Clear to auscultation bilaterally with normal respiratory effort. CV: Nondisplaced PMI.  Heart irregular S1/S2, no S3/S4, no murmur.  No peripheral edema.  Bilateral lower leg varicosities.  No carotid bruit.  Normal pedal pulses.  Abdomen: Soft, nontender, no hepatosplenomegaly, no distention.  Neurologic: Alert and oriented x 3.  Psych: Normal affect. Extremities: No clubbing or cyanosis.   Assessment/Plan: 1. CVA: Patient has had 2 CVAs => in 2011 and now in 10/13.  No evidence for carotid stenosis.  CVA was likely related to atrial fibrillation.  2. Atrial fibrillation:  Patient is in well-controlled atrial fibrillation today.  Continue current bisoprolol dose. Minimal symptoms from the atrial fibrillation iself.  Risk of recurrent stroke is high without anticoagulation given 2 prior strokes. She is now on apixaban 2.5 mg  bid (lower dose given age and low weight).  She is tolerating this with no problems.  She is stable on her feet without falls.  Check CBC and BMET today.  3. HTN: BP seems controlled.   Marca Ancona 11/14/2012

## 2012-11-14 NOTE — Patient Instructions (Addendum)
Your physician recommends that you have lab work today--BMET/CBCd.  Your physician wants you to follow-up in: 4 months with Dr Shirlee Latch. (August 2014). You will receive a reminder letter in the mail two months in advance. If you don't receive a letter, please call our office to schedule the follow-up appointment.

## 2013-03-30 ENCOUNTER — Encounter: Payer: Self-pay | Admitting: Cardiology

## 2013-03-30 ENCOUNTER — Ambulatory Visit (INDEPENDENT_AMBULATORY_CARE_PROVIDER_SITE_OTHER): Payer: Medicare Other | Admitting: Cardiology

## 2013-03-30 VITALS — BP 110/60 | HR 58 | Ht 60.0 in | Wt 102.0 lb

## 2013-03-30 DIAGNOSIS — I4891 Unspecified atrial fibrillation: Secondary | ICD-10-CM

## 2013-03-30 DIAGNOSIS — I10 Essential (primary) hypertension: Secondary | ICD-10-CM

## 2013-03-30 LAB — BASIC METABOLIC PANEL
BUN: 17 mg/dL (ref 6–23)
Calcium: 9.1 mg/dL (ref 8.4–10.5)
Creatinine, Ser: 0.6 mg/dL (ref 0.4–1.2)
GFR: 92.76 mL/min (ref >60.00)
Potassium: 3.7 mEq/L (ref 3.5–5.1)

## 2013-03-30 LAB — CBC WITH DIFFERENTIAL/PLATELET
Basophils Relative: 0.3 % (ref 0.0–3.0)
Eosinophils Absolute: 0.1 10*3/uL (ref 0.0–0.7)
MCHC: 33.7 g/dL (ref 30.0–36.0)
MCV: 91.7 fl (ref 78.0–100.0)
Monocytes Absolute: 0.6 10*3/uL (ref 0.1–1.0)
Neutrophils Relative %: 50.6 % (ref 43.0–77.0)
Platelets: 128 10*3/uL — ABNORMAL LOW (ref 150.0–400.0)
RBC: 3.98 Mil/uL (ref 3.87–5.11)

## 2013-03-30 NOTE — Patient Instructions (Addendum)
Your physician recommends that you return for lab work today--CBCd/BMET.   Your physician wants you to follow-up in: 6 months with Dr Shirlee Latch. (February 2015). You will receive a reminder letter in the mail two months in advance. If you don't receive a letter, please call our office to schedule the follow-up appointment.

## 2013-03-31 NOTE — Progress Notes (Signed)
Patient ID: Katelyn [name], female   DOB: 09-02-1921, 79 y.o.   MRN: [id number] PPCP: Dr. Eloise Harman  79 yo with history of atrial fibrillation and CVA presents for cardiology evaluation.  She was first noted to have atrial fibrillation in 2011.  At that time, she had a CVA.  She refused coumadin because her husband had been on coumadin and had an intracerebral hemorrhage.  She was put on Plavix.  She was then admitted in 10/13 with a right facial droop and was found to have a small left MCA branch territory infarction.  No tPA given due to rapid recovery of facial droop.  She was again noted to be in atrial fibrillation and again refused coumadin.  No significant stenosis on carotid dopplers.  EF normal on echo.  Since that time, she has agreed to start on apixaban.   She has tolerated this with no problems.  She remains in atrial fibrillation today.  She tires easily but denies exertional dyspnea or chest pain.  She has been steady on her feet recently with no falls.  She still drives.  No bleeding sequelae.   Labs (10/13): creatinine 0.68, LDL 50, HDL 67 Labs (12/13): HCT 35.8, K 4.2, creatinine 0.8 Labs (4/14): K 4.2, creatinine 0.7, HCT 37.2  PMH: 1. Atrial fibrillation: First diagnosed about 2 years ago.  Has refused coumadin because husband had intracerebral hemorrhage on coumadin.  Echo (10/13) with EF 50-55%, biatrial enlargement, mild MR, normal RV size and systolic fucntion.  She is on apixaban.  2. CVA in 2011 and again in 10/13.  Carotid dopplers (10/13) with no signficant stenosis.  3. HTN 4. Hypothyroidism  SH: Married, lives with her husband in [place].  No smoking.  Has 1 daughter living in [place].   FH: No premature CAD.    Current Outpatient Prescriptions  Medication Sig Dispense Refill  . apixaban (ELIQUIS) 2.5 MG TABS tablet Take 1 tablet (2.5 mg total) by mouth 2 (two) times daily.  60 tablet  11  . bisoprolol-hydrochlorothiazide (ZIAC) 5-6.25 MG per tablet Take 1 tablet  by mouth every morning.       Marland Kitchen levothyroxine (SYNTHROID, LEVOTHROID) 75 MCG tablet Take 75 mcg by mouth daily. Not on Sundays      . lovastatin (MEVACOR) 40 MG tablet Take 40 mg by mouth at bedtime.      . Vitamin D, Ergocalciferol, (DRISDOL) 50000 UNITS CAPS Take 50,000 Units by mouth every 7 (seven) days. On Saturdays       No current facility-administered medications for this visit.   BP 110/60  Pulse 58  Ht 5' (1.524 m)  Wt 46.267 kg (102 lb)  BMI 19.92 kg/m2 General: NAD Neck: No JVD, no thyromegaly or thyroid nodule.  Lungs: Clear to auscultation bilaterally with normal respiratory effort. CV: Nondisplaced PMI.  Heart irregular S1/S2, no S3/S4, no murmur.  No peripheral edema.  Bilateral lower leg varicosities.  No carotid bruit.  Normal pedal pulses.  Abdomen: Soft, nontender, no hepatosplenomegaly, no distention.  Neurologic: Alert and oriented x 3.  Psych: Normal affect. Extremities: No clubbing or cyanosis.   Assessment/Plan: 1. CVA: Patient has had 2 CVAs => in 2011 and now in 10/13.  No evidence for carotid stenosis.  CVA was likely related to atrial fibrillation.  2. Atrial fibrillation:  Patient is in well-controlled atrial fibrillation today.  Continue current bisoprolol dose. Minimal symptoms from the atrial fibrillation iself.  Risk of recurrent stroke is high without anticoagulation given 2 prior strokes.  She is now on apixaban 2.5 mg bid (lower dose given age and low weight).  She is tolerating this with no problems.  She is stable on her feet without falls.  Check CBC and BMET today.  3. HTN: BP seems controlled.   Marca Ancona 03/31/2013

## 2013-04-06 ENCOUNTER — Telehealth: Payer: Self-pay

## 2013-04-06 NOTE — Telephone Encounter (Signed)
Message copied by Charline Bills on Mon Apr 06, 2013  4:37 PM ------      Message from: Laurey Morale      Created: Thu Apr 02, 2013  4:48 PM       Stably low platelets, otherwise no significant findings ------

## 2013-04-08 NOTE — Telephone Encounter (Signed)
Spoke with this patient about her labs and patient verbalized understanding of results.

## 2013-06-18 ENCOUNTER — Other Ambulatory Visit: Payer: Self-pay | Admitting: Cardiology

## 2013-07-23 ENCOUNTER — Telehealth: Payer: Self-pay | Admitting: *Deleted

## 2013-07-23 MED ORDER — RIVAROXABAN 20 MG PO TABS: 20.0000 mg | ORAL_TABLET | Freq: Every day | ORAL | Status: DC

## 2013-07-23 NOTE — Telephone Encounter (Signed)
Pt came to office today with a letter from her insurance company that Eliquis will no longer be covered in 2015, Xarelto will be on her formulary. Reviewed with Dr Shirlee Latch --per Dr Almon Hercules can take Xarelto 20mg  daily in the place of Eliquis. Pt verbalized understanding.

## 2013-09-30 ENCOUNTER — Ambulatory Visit: Payer: Medicare Other | Admitting: Cardiology

## 2013-12-03 ENCOUNTER — Encounter: Payer: Self-pay | Admitting: Cardiology

## 2013-12-03 ENCOUNTER — Ambulatory Visit (INDEPENDENT_AMBULATORY_CARE_PROVIDER_SITE_OTHER): Payer: Medicare Other | Admitting: Cardiology

## 2013-12-03 VITALS — BP 132/82 | HR 53 | Ht 61.0 in | Wt 107.0 lb

## 2013-12-03 DIAGNOSIS — I4891 Unspecified atrial fibrillation: Secondary | ICD-10-CM

## 2013-12-03 DIAGNOSIS — I10 Essential (primary) hypertension: Secondary | ICD-10-CM

## 2013-12-03 NOTE — Patient Instructions (Signed)
Your physician recommends that you continue on your current medications as directed. Please refer to the Current Medication list given to you today.  Your physician recommends that you return for lab work in: today  Your physician wants you to follow-up in: 6 months. You will receive a reminder letter in the mail two months in advance. If you don't receive a letter, please call our office to schedule the follow-up appointment.   

## 2013-12-04 LAB — BASIC METABOLIC PANEL
BUN: 19 mg/dL (ref 6–23)
CALCIUM: 9.5 mg/dL (ref 8.4–10.5)
CO2: 30 mEq/L (ref 19–32)
CREATININE: 0.8 mg/dL (ref 0.4–1.2)
Chloride: 102 mEq/L (ref 96–112)
GFR: 71.59 mL/min (ref >60.00)
Glucose, Bld: 88 mg/dL (ref 70–99)
Potassium: 3.9 mEq/L (ref 3.5–5.1)
Sodium: 138 mEq/L (ref 135–145)

## 2013-12-04 LAB — CBC WITH DIFFERENTIAL/PLATELET
BASOS ABS: 0 10*3/uL (ref 0.0–0.1)
BASOS PCT: 0.5 % (ref 0.0–3.0)
EOS ABS: 0 10*3/uL (ref 0.0–0.7)
Eosinophils Relative: 1.2 % (ref 0.0–5.0)
HCT: 37.8 % (ref 36.0–46.0)
HEMOGLOBIN: 12.6 g/dL (ref 12.0–15.0)
LYMPHS ABS: 1.3 10*3/uL (ref 0.7–4.0)
Lymphocytes Relative: 30.9 % (ref 12.0–46.0)
MCHC: 33.3 g/dL (ref 30.0–36.0)
MCV: 92.2 fl (ref 78.0–100.0)
MONO ABS: 0.4 10*3/uL (ref 0.1–1.0)
Monocytes Relative: 10.3 % (ref 3.0–12.0)
NEUTROS ABS: 2.3 10*3/uL (ref 1.4–7.7)
Neutrophils Relative %: 57.1 % (ref 43.0–77.0)
Platelets: 139 10*3/uL — ABNORMAL LOW (ref 150.0–400.0)
RBC: 4.1 Mil/uL (ref 3.87–5.11)
RDW: 14.5 % (ref 11.5–14.6)
WBC: 4.1 10*3/uL — ABNORMAL LOW (ref 4.5–10.5)

## 2013-12-05 NOTE — Progress Notes (Signed)
Patient ID: Katelyn MarekMary C [name], female   DOB: May 12, 1922, 80 y.o.   MRN: [id number] PCP: Dr. Eloise HarmanPaterson  80 yo with history of atrial fibrillation and CVA presents for cardiology evaluation.  She was first noted to have atrial fibrillation in 2011.  At that time, she had a CVA.  She refused coumadin because her husband had been on coumadin and had an intracerebral hemorrhage.  She was put on Plavix.  She was then admitted in 10/13 with a right facial droop and was found to have a small left MCA branch territory infarction.  No tPA given due to rapid recovery of facial droop.  She was again noted to be in atrial fibrillation and again refused coumadin.  No significant stenosis on carotid dopplers.  EF normal on echo.  Since that time, she has agreed to start Xarelto.   She has tolerated this with no problems.  She remains in atrial fibrillation today.  She tires easily but denies exertional dyspnea or chest pain.  She has had 1 fall, tripped back in 2/15.  She had some bruising but no major injury.  She still drives.  No lightheadedness or syncope.  Labs (10/13): creatinine 0.68, LDL 50, HDL 67 Labs (12/13): HCT 35.8, K 4.2, creatinine 0.8 Labs (4/14): K 4.2, creatinine 0.7, HCT 37.2 Labs (8/14): K 3.7, creatinine 0.6, HCT 36.5  ECG: atrial fibrillation, nonspecific T wave flattening  PMH: 1. Atrial fibrillation: First diagnosed about 2 years ago.  Has refused coumadin because husband had intracerebral hemorrhage on coumadin.  Echo (10/13) with EF 50-55%, biatrial enlargement, mild MR, normal RV size and systolic fucntion.  She is on Xarelto.  2. CVA in 2011 and again in 10/13.  Carotid dopplers (10/13) with no signficant stenosis.  3. HTN 4. Hypothyroidism  SH: Married, lives [place] in [place].  No smoking.  Has 1 daughter living in [place].   FH: No premature CAD.    Current Outpatient Prescriptions  Medication Sig Dispense Refill  . bisoprolol-hydrochlorothiazide (ZIAC) 5-6.25 MG per  tablet Take 1 tablet by mouth every morning.       Marland Kitchen. LATANOPROST OP Apply 1 drop to eye at bedtime.      Marland Kitchen. levothyroxine (SYNTHROID, LEVOTHROID) 75 MCG tablet Take 75 mcg by mouth daily. Not on Sundays      . lovastatin (MEVACOR) 40 MG tablet Take 40 mg by mouth at bedtime.      . Rivaroxaban (XARELTO) 20 MG TABS tablet Take 1 tablet (20 mg total) by mouth daily with supper.  30 tablet  3  . Vitamin D, Ergocalciferol, (DRISDOL) 50000 UNITS CAPS Take 50,000 Units by mouth every 7 (seven) days. On Saturdays       No current facility-administered medications for this visit.   BP 132/82  Pulse 53  Ht 5\' 1"  (1.549 m)  Wt 48.535 kg (107 lb)  BMI 20.23 kg/m2 General: NAD Neck: No JVD, no thyromegaly or thyroid nodule.  Lungs: Clear to auscultation bilaterally with normal respiratory effort. CV: Nondisplaced PMI.  Heart irregular S1/S2, no S3/S4, no murmur.  1+ ankle edema.  Bilateral lower leg varicosities.  No carotid bruit.  Normal pedal pulses.  Abdomen: Soft, nontender, no hepatosplenomegaly, no distention.  Neurologic: Alert and oriented x 3.  Psych: Normal affect. Extremities: No clubbing or cyanosis.   Assessment/Plan: 1. CVA: Patient has had 2 CVAs => in 2011 and now in 10/13.  No evidence for carotid stenosis.  CVA was likely related to atrial fibrillation.  2. Atrial fibrillation:  Patient is in well-controlled atrial fibrillation today.  Continue current bisoprolol dose. Minimal symptoms from the atrial fibrillation iself.  Risk of recurrent stroke is high without anticoagulation given 2 prior strokes. She is now on Xarelto.  She has tolerated this with no problems.  Check CBC and BMET today.  3. HTN: BP seems controlled.   Laurey MoraleDalton S Hadlei Stitt 12/05/2013

## 2013-12-17 ENCOUNTER — Other Ambulatory Visit: Payer: Self-pay | Admitting: Cardiology

## 2014-03-25 ENCOUNTER — Telehealth: Payer: Self-pay | Admitting: Cardiology

## 2014-03-25 NOTE — Telephone Encounter (Signed)
I spoke with the pt and she is scheduled for cataract surgery on 04/06/14 with Dr Dagoberto LigasStoneburner.  The pt said Dr Dagoberto LigasStoneburner advised her to touch base with our office for further instructions in regard to holding Xarelto prior to surgery.  The pt does have a history of CVA and AFib.  I made the pt aware that Dr Shirlee LatchMcLean is not in the office this week and that I will forward this message to him for review.  The pt would like to be notified with Dr Alford HighlandMcLean's recommendation and asked that we also let Dr Vic BlackbirdStoneburner's office know Dr Alford HighlandMcLean's recommendation.

## 2014-03-25 NOTE — Telephone Encounter (Signed)
New message     Pt is on xeralto.  She is having cataract surgery on 8-25.  Should she stop her medication prior?

## 2014-03-25 NOTE — Telephone Encounter (Signed)
Usually they don't make people stop anticoagulation for cataract surgery.  Would just continue the Xarelto if ok with ophthalmologist.

## 2014-03-26 NOTE — Telephone Encounter (Signed)
[phone] Dr Dagoberto LigasStoneburner.  I spoke with the pt and made her aware of this information. I contacted Dr Vic BlackbirdStoneburner's office and left a voicemail for the nurse that the pt has been advised to remain on Xarelto prior to surgery.  I also instructed them to contact our office if Dr Dagoberto LigasStoneburner has other thoughts about Xarelto prior to cataract surgery.

## 2014-04-15 ENCOUNTER — Other Ambulatory Visit: Payer: Self-pay | Admitting: Cardiology

## 2014-05-17 ENCOUNTER — Other Ambulatory Visit: Payer: Self-pay | Admitting: Cardiology

## 2014-05-19 ENCOUNTER — Other Ambulatory Visit: Payer: Self-pay | Admitting: Cardiology

## 2014-06-04 ENCOUNTER — Encounter: Payer: Self-pay | Admitting: *Deleted

## 2014-06-07 ENCOUNTER — Encounter: Payer: Self-pay | Admitting: Cardiology

## 2014-06-07 ENCOUNTER — Ambulatory Visit (INDEPENDENT_AMBULATORY_CARE_PROVIDER_SITE_OTHER): Payer: Medicare Other | Admitting: Cardiology

## 2014-06-07 VITALS — BP 118/68 | HR 51 | Ht 61.0 in | Wt 94.1 lb

## 2014-06-07 DIAGNOSIS — I10 Essential (primary) hypertension: Secondary | ICD-10-CM

## 2014-06-07 DIAGNOSIS — G458 Other transient cerebral ischemic attacks and related syndromes: Secondary | ICD-10-CM

## 2014-06-07 DIAGNOSIS — I482 Chronic atrial fibrillation, unspecified: Secondary | ICD-10-CM

## 2014-06-07 MED ORDER — BISOPROLOL FUMARATE 5 MG PO TABS: ORAL_TABLET | ORAL | Status: DC

## 2014-06-07 NOTE — Patient Instructions (Signed)
Stop bisoprolol/HCTZ.  Start bisoprolol 2.5mg  daily. This will be 1/2 of a 5mg  tablet daily.  Your physician recommends that you have lab work today--BMET/CBCd  Your physician wants you to follow-up in: 6 months with Dr Shirlee LatchMcLean. (April 2016).  You will receive a reminder letter in the mail two months in advance. If you don't receive a letter, please call our office to schedule the follow-up appointment.

## 2014-06-07 NOTE — Progress Notes (Signed)
Patient ID: Katelyn MarekMary C Bussie, female   DOB: 1922/03/16, 80 y.o.   MRN: [id number] PCP: Dr. Eloise HarmanPaterson  80 yo with history of atrial fibrillation and CVA presents for cardiology evaluation.  She was first noted to have atrial fibrillation in 2011.  At that time, she had a CVA.  She refused coumadin because her husband had been on coumadin and had an intracerebral hemorrhage.  She was put on Plavix.  She was then admitted in 10/13 with a right facial droop and was found to have a small left MCA branch territory infarction.  No tPA given due to rapid recovery of facial droop.  She was again noted to be in atrial fibrillation and again refused coumadin.  No significant stenosis on carotid dopplers.  EF normal on echo.  Since that time, she has agreed to start Xarelto.   She has tolerated this with no problems.  She remains in atrial fibrillation today.  She tires easily but denies exertional dyspnea or chest pain. She does most of the housework. She has had 1 fall, tripped back in 2/15.  HR is a bit low around 50 today.  She still drives.  No lightheadedness or syncope.  Labs (10/13): creatinine 0.68, LDL 50, HDL 67 Labs (12/13): HCT 35.8, K 4.2, creatinine 0.8 Labs (4/14): K 4.2, creatinine 0.7, HCT 37.2 Labs (8/14): K 3.7, creatinine 0.6, HCT 36.5 Labs (4/15): K 3.9, creatinine 0.8, HCT 37.8  ECG: atrial fibrillation, nonspecific T wave flattening  PMH: 1. Atrial fibrillation: First diagnosed about 2 years ago.  Has refused coumadin because husband had intracerebral hemorrhage on coumadin.  Echo (10/13) with EF 50-55%, biatrial enlargement, mild MR, normal RV size and systolic fucntion.  She is on Xarelto.  2. CVA in 2011 and again in 10/13.  Carotid dopplers (10/13) with no signficant stenosis.  3. HTN 4. Hypothyroidism  SH: Married, lives [place] her [place] in [place].  No smoking.  Has 1 daughter living in [place].   FH: No premature CAD.   ROS: All systems reviewed and negative except as per HPI.     Current Outpatient Prescriptions  Medication Sig Dispense Refill  . bisoprolol (ZEBETA) 5 MG tablet 1/2 tablet (2.5mg ) daily  15 tablet  6  . LATANOPROST OP Apply 1 drop to eye at bedtime.      Marland Kitchen. levothyroxine (SYNTHROID, LEVOTHROID) 75 MCG tablet Take 75 mcg by mouth daily. Not on Sundays      . lovastatin (MEVACOR) 40 MG tablet Take 40 mg by mouth at bedtime.      . Vitamin D, Ergocalciferol, (DRISDOL) 50000 UNITS CAPS Take 50,000 Units by mouth every 7 (seven) days. On Saturdays      . XARELTO 20 MG TABS tablet TAKE 1 TABLET BY MOUTH EVERY DAY WITH SUPPER  30 tablet  0   No current facility-administered medications for this visit.   BP 118/68  Pulse 51  Ht 5\' 1"  (1.549 m)  Wt 94 lb 1.9 oz (42.693 kg)  BMI 17.79 kg/m2 General: NAD Neck: No JVD, no thyromegaly or thyroid nodule.  Lungs: Clear to auscultation bilaterally with normal respiratory effort. CV: Nondisplaced PMI.  Heart brady, irregular S1/S2, no S3/S4, no murmur.  No edema.  Bilateral lower leg varicosities.  No carotid bruit.  Normal pedal pulses.  Abdomen: Soft, nontender, no hepatosplenomegaly, no distention.  Neurologic: Alert and oriented x 3.  Psych: Normal affect. Extremities: No clubbing or cyanosis.   Assessment/Plan: 1. CVA: Patient has had 2 CVAs =>  in 2011 and now in 10/13.  No evidence for carotid stenosis.  CVA was likely related to atrial fibrillation.  2. Atrial fibrillation:  Patient is in rate-controlled atrial fibrillation today.   - Risk of recurrent stroke is high without anticoagulation given 2 prior strokes. She is now on Xarelto.  She has tolerated this with no problems.  Check CBC and BMET today.  - HR is a bit low.  I will have her stop bisoprolol/HCT and go on a lower dose of bisoprolol, 2.5 mg daily.  3. HTN: BP seems controlled.   Marca AnconaDalton Vernia Teem 06/07/2014

## 2014-06-08 LAB — CBC WITH DIFFERENTIAL/PLATELET
Basophils Absolute: 0 10*3/uL (ref 0.0–0.1)
Basophils Relative: 0.5 % (ref 0.0–3.0)
EOS PCT: 1.4 % (ref 0.0–5.0)
Eosinophils Absolute: 0.1 10*3/uL (ref 0.0–0.7)
HCT: 38 % (ref 36.0–46.0)
Hemoglobin: 12.6 g/dL (ref 12.0–15.0)
LYMPHS PCT: 31.3 % (ref 12.0–46.0)
Lymphs Abs: 1.2 10*3/uL (ref 0.7–4.0)
MCHC: 33.2 g/dL (ref 30.0–36.0)
MCV: 94.5 fl (ref 78.0–100.0)
MONO ABS: 0.5 10*3/uL (ref 0.1–1.0)
MONOS PCT: 11.7 % (ref 3.0–12.0)
NEUTROS PCT: 55.1 % (ref 43.0–77.0)
Neutro Abs: 2.1 10*3/uL (ref 1.4–7.7)
PLATELETS: 134 10*3/uL — AB (ref 150.0–400.0)
RBC: 4.02 Mil/uL (ref 3.87–5.11)
RDW: 13.9 % (ref 11.5–15.5)
WBC: 3.9 10*3/uL — AB (ref 4.0–10.5)

## 2014-06-08 LAB — BASIC METABOLIC PANEL
BUN: 19 mg/dL (ref 6–23)
CHLORIDE: 100 meq/L (ref 96–112)
CO2: 24 mEq/L (ref 19–32)
Calcium: 9.3 mg/dL (ref 8.4–10.5)
Creatinine, Ser: 0.7 mg/dL (ref 0.4–1.2)
GFR: 82.07 mL/min (ref >60.00)
GLUCOSE: 80 mg/dL (ref 70–99)
POTASSIUM: 3.7 meq/L (ref 3.5–5.1)
SODIUM: 136 meq/L (ref 135–145)

## 2014-06-09 ENCOUNTER — Telehealth: Payer: Self-pay | Admitting: Cardiology

## 2014-06-09 ENCOUNTER — Other Ambulatory Visit: Payer: Self-pay | Admitting: *Deleted

## 2014-06-09 NOTE — Telephone Encounter (Signed)
°  Patient has questions regarding medication. She is very confused on what to take, please call and advise.

## 2014-06-09 NOTE — Telephone Encounter (Signed)
Spoke with patient and answered questions about medications.

## 2014-06-15 ENCOUNTER — Other Ambulatory Visit: Payer: Self-pay | Admitting: Cardiology

## 2014-07-12 ENCOUNTER — Other Ambulatory Visit: Payer: Self-pay | Admitting: Internal Medicine

## 2014-07-12 DIAGNOSIS — I679 Cerebrovascular disease, unspecified: Secondary | ICD-10-CM

## 2014-07-12 DIAGNOSIS — H547 Unspecified visual loss: Secondary | ICD-10-CM

## 2014-07-12 DIAGNOSIS — R519 Headache, unspecified: Secondary | ICD-10-CM

## 2014-07-12 DIAGNOSIS — R51 Headache: Principal | ICD-10-CM

## 2014-07-13 ENCOUNTER — Ambulatory Visit
Admission: RE | Admit: 2014-07-13 | Discharge: 2014-07-13 | Disposition: A | Discharge status: Home / Self Care | Payer: Medicare Other | Source: Ambulatory Visit | Attending: Internal Medicine | Admitting: Internal Medicine

## 2014-07-13 DIAGNOSIS — I679 Cerebrovascular disease, unspecified: Secondary | ICD-10-CM

## 2014-07-13 DIAGNOSIS — H547 Unspecified visual loss: Secondary | ICD-10-CM

## 2014-07-13 DIAGNOSIS — R519 Headache, unspecified: Secondary | ICD-10-CM

## 2014-07-13 DIAGNOSIS — R51 Headache: Principal | ICD-10-CM

## 2014-07-20 ENCOUNTER — Ambulatory Visit
Admission: RE | Admit: 2014-07-20 | Discharge: 2014-07-20 | Disposition: A | Discharge status: Home / Self Care | Payer: Medicare Other | Source: Ambulatory Visit | Attending: Internal Medicine | Admitting: Internal Medicine

## 2014-07-20 ENCOUNTER — Other Ambulatory Visit: Payer: Medicare Other

## 2014-07-20 DIAGNOSIS — I679 Cerebrovascular disease, unspecified: Secondary | ICD-10-CM

## 2014-07-20 DIAGNOSIS — R51 Headache: Principal | ICD-10-CM

## 2014-07-20 DIAGNOSIS — H547 Unspecified visual loss: Secondary | ICD-10-CM

## 2014-07-20 DIAGNOSIS — R519 Headache, unspecified: Secondary | ICD-10-CM

## 2014-11-18 ENCOUNTER — Emergency Department (HOSPITAL_COMMUNITY)
Admission: EM | Admit: 2014-11-18 | Discharge: 2014-11-18 | Disposition: A | Discharge status: Home / Self Care | Payer: Medicare HMO | Attending: Emergency Medicine | Admitting: Emergency Medicine

## 2014-11-18 ENCOUNTER — Emergency Department (HOSPITAL_COMMUNITY): Payer: Medicare HMO

## 2014-11-18 ENCOUNTER — Encounter (HOSPITAL_COMMUNITY): Payer: Self-pay | Admitting: General Practice

## 2014-11-18 ENCOUNTER — Other Ambulatory Visit: Payer: Self-pay

## 2014-11-18 DIAGNOSIS — Y998 Other external cause status: Secondary | ICD-10-CM | POA: Diagnosis not present

## 2014-11-18 DIAGNOSIS — E079 Disorder of thyroid, unspecified: Secondary | ICD-10-CM | POA: Insufficient documentation

## 2014-11-18 DIAGNOSIS — W19XXXA Unspecified fall, initial encounter: Secondary | ICD-10-CM

## 2014-11-18 DIAGNOSIS — S199XXA Unspecified injury of neck, initial encounter: Secondary | ICD-10-CM | POA: Diagnosis not present

## 2014-11-18 DIAGNOSIS — W01198A Fall on same level from slipping, tripping and stumbling with subsequent striking against other object, initial encounter: Secondary | ICD-10-CM | POA: Diagnosis not present

## 2014-11-18 DIAGNOSIS — Z8739 Personal history of other diseases of the musculoskeletal system and connective tissue: Secondary | ICD-10-CM | POA: Insufficient documentation

## 2014-11-18 DIAGNOSIS — Z79899 Other long term (current) drug therapy: Secondary | ICD-10-CM | POA: Diagnosis not present

## 2014-11-18 DIAGNOSIS — S2231XA Fracture of one rib, right side, initial encounter for closed fracture: Secondary | ICD-10-CM

## 2014-11-18 DIAGNOSIS — Y9301 Activity, walking, marching and hiking: Secondary | ICD-10-CM | POA: Insufficient documentation

## 2014-11-18 DIAGNOSIS — I10 Essential (primary) hypertension: Secondary | ICD-10-CM | POA: Insufficient documentation

## 2014-11-18 DIAGNOSIS — Z8673 Personal history of transient ischemic attack (TIA), and cerebral infarction without residual deficits: Secondary | ICD-10-CM | POA: Diagnosis not present

## 2014-11-18 DIAGNOSIS — I4891 Unspecified atrial fibrillation: Secondary | ICD-10-CM | POA: Insufficient documentation

## 2014-11-18 DIAGNOSIS — Y92008 Other place in unspecified non-institutional (private) residence as the place of occurrence of the external cause: Secondary | ICD-10-CM | POA: Diagnosis not present

## 2014-11-18 DIAGNOSIS — S0101XA Laceration without foreign body of scalp, initial encounter: Secondary | ICD-10-CM | POA: Insufficient documentation

## 2014-11-18 DIAGNOSIS — S0990XA Unspecified injury of head, initial encounter: Secondary | ICD-10-CM | POA: Diagnosis present

## 2014-11-18 MED ORDER — KETOROLAC TROMETHAMINE 60 MG/2ML IM SOLN: 60.0000 mg | Freq: Once | INTRAMUSCULAR | Status: DC

## 2014-11-18 MED ORDER — OXYCODONE-ACETAMINOPHEN 5-325 MG PO TABS: 1.0000 | ORAL_TABLET | ORAL | Status: DC | PRN

## 2014-11-18 MED ORDER — OXYCODONE-ACETAMINOPHEN 5-325 MG PO TABS
1.0000 | ORAL_TABLET | Freq: Once | ORAL | Status: AC
  Administered 2014-11-18: 1 via ORAL
  Filled 2014-11-18: qty 1

## 2014-11-18 MED ORDER — TRAMADOL HCL 50 MG PO TABS
50.0000 mg | ORAL_TABLET | Freq: Once | ORAL | Status: AC
  Administered 2014-11-18: 50 mg via ORAL
  Filled 2014-11-18: qty 1

## 2014-11-18 MED ORDER — TRAMADOL HCL 50 MG PO TABS: 50.0000 mg | ORAL_TABLET | Freq: Four times a day (QID) | ORAL | Status: DC | PRN

## 2014-11-18 NOTE — ED Notes (Signed)
Pt brought in via [facility]. Pt tripped and fell at home hitting the back of her head. Pt complains of right hip, shoulder, and rib cage pain. Pt did not have a loss of consciousness and is A/O. Pt has a history of A-fib and takes Xarelto. Pt has a laceration to the back of her head with a small hematoma. Pt denies any neck or back pain. EMS V/S BP 167/81, HR 52, SPO2 98%, and RR 14.

## 2014-11-18 NOTE — Discharge Instructions (Signed)

## 2014-11-18 NOTE — ED Provider Notes (Signed)
CSN: [id number]     Arrival date & time 11/18/14  1858 History   First MD Initiated Contact with Patient 11/18/14 1913     Chief Complaint  Patient presents with  . Fall      The history is provided by the patient.   patient is brought to the emergency department after a mechanical fall this afternoon.  She was walking in her house promptly and slipped resulting in injury to her head and her right ribs.  She is on anticoagulation with xarelto.  She denies loss consciousness.  She denies nausea and vomiting.  She has a small laceration of her right posterior scalp with the bleeding controlled.  No significant hematoma.  She reports right-sided rib tenderness and pain is worse with movement and palpation of her right lateral ribs.  She's not wanting anything for pain at this time.  She denies significant neck pain.  She denies weakness of her arms or legs.  She denies abdominal pain.  Past Medical History  Diagnosis Date  . Hypertension   . Thyroid disease   . Arthritis   . Stroke   . Atrial fibrillation    Past Surgical History  Procedure Laterality Date  . Hernia repair     No family history on file. History  Substance Use Topics  . Smoking status: Never Smoker   . Smokeless tobacco: Not on file  . Alcohol Use: No   OB History    No data available     Review of Systems  All other systems reviewed and are negative.     Allergies  Review of patient's allergies indicates no known allergies.  Home Medications   Prior to Admission medications   Medication Sig Start Date End Date Taking? Authorizing Provider  bisoprolol (ZEBETA) 5 MG tablet 1/2 tablet (2.5mg ) daily 06/07/14   Laurey Moralealton S McLean, MD  latanoprost (XALATAN) 0.005 % ophthalmic solution Place 1 drop into both eyes at bedtime. 06/09/14   Laurey Moralealton S McLean, MD  levothyroxine (SYNTHROID, LEVOTHROID) 75 MCG tablet Take 75 mcg by mouth daily. Not on Sundays    Historical Provider, MD  lovastatin (MEVACOR) 40 MG tablet  Take 40 mg by mouth at bedtime.    Historical Provider, MD  Vitamin D, Ergocalciferol, (DRISDOL) 50000 UNITS CAPS Take 50,000 Units by mouth every 7 (seven) days. On Saturdays    Historical Provider, MD  XARELTO 20 MG TABS tablet TAKE 1 TABLET BY MOUTH EVERY DAY WITH SUPPER 06/16/14   Laurey Moralealton S McLean, MD   BP 174/102 mmHg  Pulse 62  Resp 22  Ht 5\' 2"  (1.575 m)  Wt 98 lb (44.453 kg)  BMI 17.92 kg/m2  SpO2 99% Physical Exam  Constitutional: She is oriented to person, place, and time. She appears well-developed and well-nourished. No distress.  HENT:  Head: Normocephalic.  Right lateral posterior scalp with a small 1 cm laceration.  Bleeding controlled.  Dry blood noted.  No significant hematoma.  Eyes: EOM are normal.  Neck: Neck supple.  Mild paracervical tenderness without cervical tenderness.  Cardiovascular: Normal rate, regular rhythm and normal heart sounds.   Pulmonary/Chest: Effort normal and breath sounds normal.  Abdominal: Soft. She exhibits no distension. There is no tenderness.  Musculoskeletal: Normal range of motion.  Neurological: She is alert and oriented to person, place, and time.  Skin: Skin is warm and dry.  Psychiatric: She has a normal mood and affect. Judgment normal.  Nursing note and vitals reviewed.   ED Course  Procedures (including critical care time)  LACERATION REPAIR Performed by: Lyanne Co Consent: Verbal consent obtained. Risks and benefits: risks, benefits and alternatives were discussed Patient identity confirmed: provided demographic data Time out performed prior to procedure Prepped and Draped in normal sterile fashion Wound explored Laceration Location: right posterior scalp Laceration Length: 1cm No Foreign Bodies seen or palpated Anesthesia:none Irrigation method: syringe Amount of cleaning: standard Skin closure: staple Number of sutures or staples: 2 Technique: staple Patient tolerance: Patient tolerated the procedure well  with no immediate complications.   Labs Review Labs Reviewed - No data to display  Imaging Review Dg Chest 2 View  11/18/2014   CLINICAL DATA:  Right-sided chest pain following fall today, initial encounter  EXAM: CHEST  2 VIEW  COMPARISON:  12/12/2011  FINDINGS: Cardiac shadow is stable but mildly enlarged. The lungs are well aerated bilaterally. No focal infiltrate or sizable effusion is seen. No pneumothorax is noted. Mild deformity of the right ninth rib is noted posteriorly. This may be acute in nature.  IMPRESSION: Right ninth rib deformity which may be acute in nature. No other focal abnormality is seen.   Electronically Signed   By: Alcide Clever M.D.   On: 11/18/2014 20:31   Ct Head Wo Contrast  11/18/2014   CLINICAL DATA:  Fall at home with trauma to the back of head. Patient denies neck pain. Initial encounter.  EXAM: CT HEAD WITHOUT CONTRAST  CT CERVICAL SPINE WITHOUT CONTRAST  TECHNIQUE: Multidetector CT imaging of the head and cervical spine was performed following the standard protocol without intravenous contrast. Multiplanar CT image reconstructions of the cervical spine were also generated.  COMPARISON:  Head CT 05/21/2012  FINDINGS: CT HEAD FINDINGS  Skull and Sinuses:Right occipital scalp laceration without underlying fracture.  Orbits: Bilateral cataract resection.  No traumatic findings  Brain: No evidence of acute infarction, hemorrhage, hydrocephalus, or mass lesion/mass effect. There is generalized brain atrophy which is mildly progressed from 2013. Chronic small-vessel disease with ischemic gliosis mainly in the periventricular region. There is a remote small cortical infarct in the right occipital region.  CT CERVICAL SPINE FINDINGS  No evidence of cervical spine fracture. No suspected traumatic malalignment (mild C4-5 anterolisthesis was developing in 2006 and mild C5-6 anterolisthesis is a associated with advanced facet osteoarthritis, the usual cause). Focal degenerative disc  narrowing and spurring at C6-7. There is no evidence of spinal canal hematoma or prevertebral edema.  IMPRESSION: 1. No evidence of intracranial injury or cervical spine fracture. 2. Right occipital scalp laceration without fracture.   Electronically Signed   By: Marnee Spring M.D.   On: 11/18/2014 20:58   Ct Cervical Spine Wo Contrast  11/18/2014   CLINICAL DATA:  Fall at home with trauma to the back of head. Patient denies neck pain. Initial encounter.  EXAM: CT HEAD WITHOUT CONTRAST  CT CERVICAL SPINE WITHOUT CONTRAST  TECHNIQUE: Multidetector CT imaging of the head and cervical spine was performed following the standard protocol without intravenous contrast. Multiplanar CT image reconstructions of the cervical spine were also generated.  COMPARISON:  Head CT 05/21/2012  FINDINGS: CT HEAD FINDINGS  Skull and Sinuses:Right occipital scalp laceration without underlying fracture.  Orbits: Bilateral cataract resection.  No traumatic findings  Brain: No evidence of acute infarction, hemorrhage, hydrocephalus, or mass lesion/mass effect. There is generalized brain atrophy which is mildly progressed from 2013. Chronic small-vessel disease with ischemic gliosis mainly in the periventricular region. There is a remote small cortical infarct in the right  occipital region.  CT CERVICAL SPINE FINDINGS  No evidence of cervical spine fracture. No suspected traumatic malalignment (mild C4-5 anterolisthesis was developing in 2006 and mild C5-6 anterolisthesis is a associated with advanced facet osteoarthritis, the usual cause). Focal degenerative disc narrowing and spurring at C6-7. There is no evidence of spinal canal hematoma or prevertebral edema.  IMPRESSION: 1. No evidence of intracranial injury or cervical spine fracture. 2. Right occipital scalp laceration without fracture.   Electronically Signed   By: Marnee Spring M.D.   On: 11/18/2014 20:58  I personally reviewed the imaging tests through PACS system I reviewed  available ER/hospitalization records through the EMR    EKG Interpretation None      MDM   Final diagnoses:  Scalp laceration, initial encounter  Fall, initial encounter  Right rib fracture, closed, initial encounter    Laceration repaired.  Head injury warnings.  Infection warnings.  Incentive spirometry and pain control.  Right rib fracture.  Pneumonia precautions given.  Patient and family understand.    Azalia Bilis, MD 11/18/14 2144

## 2014-11-18 NOTE — ED Notes (Signed)
MD at bedside suturing head wound.

## 2014-12-06 ENCOUNTER — Ambulatory Visit: Payer: Self-pay | Admitting: Cardiology

## 2014-12-15 ENCOUNTER — Other Ambulatory Visit: Payer: Self-pay

## 2014-12-15 DIAGNOSIS — I482 Chronic atrial fibrillation, unspecified: Secondary | ICD-10-CM

## 2014-12-15 MED ORDER — BISOPROLOL FUMARATE 5 MG PO TABS: ORAL_TABLET | ORAL | Status: DC

## 2015-01-06 ENCOUNTER — Other Ambulatory Visit: Payer: Self-pay | Admitting: *Deleted

## 2015-01-06 MED ORDER — RIVAROXABAN 20 MG PO TABS: ORAL_TABLET | ORAL | Status: DC

## 2015-02-09 ENCOUNTER — Ambulatory Visit: Payer: Medicare HMO | Admitting: Podiatry

## 2015-02-16 ENCOUNTER — Ambulatory Visit: Payer: Medicare HMO | Admitting: Podiatry

## 2015-02-17 ENCOUNTER — Ambulatory Visit: Payer: Self-pay | Admitting: Cardiology

## 2015-05-03 ENCOUNTER — Other Ambulatory Visit: Payer: Self-pay | Admitting: Cardiology

## 2015-05-07 ENCOUNTER — Other Ambulatory Visit: Payer: Self-pay | Admitting: Cardiology

## 2015-05-29 ENCOUNTER — Other Ambulatory Visit: Payer: Self-pay | Admitting: Cardiology

## 2015-05-30 ENCOUNTER — Other Ambulatory Visit: Payer: Self-pay | Admitting: Cardiology

## 2015-06-09 ENCOUNTER — Other Ambulatory Visit: Payer: Self-pay | Admitting: Cardiology

## 2015-06-21 ENCOUNTER — Ambulatory Visit (INDEPENDENT_AMBULATORY_CARE_PROVIDER_SITE_OTHER): Payer: Medicare HMO | Admitting: Physician Assistant

## 2015-06-21 ENCOUNTER — Encounter: Payer: Self-pay | Admitting: Physician Assistant

## 2015-06-21 VITALS — BP 132/75 | HR 77 | Ht 62.0 in | Wt 90.0 lb

## 2015-06-21 DIAGNOSIS — I482 Chronic atrial fibrillation, unspecified: Secondary | ICD-10-CM

## 2015-06-21 DIAGNOSIS — E039 Hypothyroidism, unspecified: Secondary | ICD-10-CM

## 2015-06-21 DIAGNOSIS — Z8673 Personal history of transient ischemic attack (TIA), and cerebral infarction without residual deficits: Secondary | ICD-10-CM

## 2015-06-21 DIAGNOSIS — D696 Thrombocytopenia, unspecified: Secondary | ICD-10-CM

## 2015-06-21 DIAGNOSIS — D72819 Decreased white blood cell count, unspecified: Secondary | ICD-10-CM | POA: Insufficient documentation

## 2015-06-21 DIAGNOSIS — I10 Essential (primary) hypertension: Secondary | ICD-10-CM

## 2015-06-21 DIAGNOSIS — I4891 Unspecified atrial fibrillation: Secondary | ICD-10-CM | POA: Diagnosis not present

## 2015-06-21 LAB — CBC WITH DIFFERENTIAL/PLATELET
BASOS ABS: 0 10*3/uL (ref 0.0–0.1)
BASOS PCT: 0 % (ref 0–1)
Eosinophils Absolute: 0 10*3/uL (ref 0.0–0.7)
Eosinophils Relative: 1 % (ref 0–5)
HEMATOCRIT: 37 % (ref 36.0–46.0)
HEMOGLOBIN: 12.7 g/dL (ref 12.0–15.0)
LYMPHS PCT: 36 % (ref 12–46)
Lymphs Abs: 1.4 10*3/uL (ref 0.7–4.0)
MCH: 31.4 pg (ref 26.0–34.0)
MCHC: 34.3 g/dL (ref 30.0–36.0)
MCV: 91.6 fL (ref 78.0–100.0)
MONO ABS: 0.5 10*3/uL (ref 0.1–1.0)
MPV: 10.7 fL (ref 8.6–12.4)
Monocytes Relative: 12 % (ref 3–12)
NEUTROS PCT: 51 % (ref 43–77)
Neutro Abs: 1.9 10*3/uL (ref 1.7–7.7)
Platelets: 165 10*3/uL (ref 150–400)
RBC: 4.04 MIL/uL (ref 3.87–5.11)
RDW: 13.8 % (ref 11.5–15.5)
WBC: 3.8 10*3/uL — AB (ref 4.0–10.5)

## 2015-06-21 LAB — BASIC METABOLIC PANEL
BUN: 14 mg/dL (ref 7–25)
CALCIUM: 9.7 mg/dL (ref 8.6–10.4)
CO2: 29 mmol/L (ref 20–31)
Chloride: 101 mmol/L (ref 98–110)
Creat: 0.69 mg/dL (ref 0.60–0.88)
Glucose, Bld: 90 mg/dL (ref 65–99)
Potassium: 4.1 mmol/L (ref 3.5–5.3)
Sodium: 140 mmol/L (ref 135–146)

## 2015-06-21 MED ORDER — BISOPROLOL FUMARATE 5 MG PO TABS: 2.5000 mg | ORAL_TABLET | Freq: Every day | ORAL | Status: DC

## 2015-06-21 NOTE — Patient Instructions (Signed)
Medication Instructions:  Your physician recommends that you continue on your current medications as directed. Please refer to the Current Medication list given to you today.   Labwork: TODAY:  BMET                CBC W/DIFF                TSH  Testing/Procedures: None ordered  Follow-Up: Your physician wants you to follow-up in: 6 MONTHS WITH DR. Shirlee LatchMCLEAN.  You will receive a reminder letter in the mail two months in advance. If you don't receive a letter, please call our office to schedule the follow-up appointment.   Any Other Special Instructions Will Be Listed Below (If Applicable).     If you need a refill on your cardiac medications before your next appointment, please call your pharmacy.

## 2015-06-21 NOTE — Progress Notes (Addendum)
Cardiology Office Note Date:  06/21/2015  Patient ID:  Katelyn [name], DOB [birth year], MRN [id number] PCP:  Garlan FillersPATERSON,DANIEL G, MD  Cardiologist:  [facility] [address]  Chief Complaint: f/u atrial fib  History of Present Illness: Katelyn [name] is a 81 y.o. female with history of persistent atrial fib (likely chronic now), 2 strokes without significant residual deficit, HTN, hypothyroidism who presents for post-hospital follow-up. Per review of chart, she was first noted to have atrial fibrillation in 2011. At that time, she had a CVA. She refused coumadin because her husband had been on coumadin and had an intracerebral hemorrhage.She was put on Plavix.She was then admitted in 10/13 with a right facial droop and was found to have a small left MCA branch territory infarction. She was again noted to be in atrial fibrillation and again refused coumadin. No significant stenosis on carotid dopplers.2D echo 05/2012: EF 50-55%, mod-severely dilated LA, flattened closure of MV without frank prolapse; mild MR, mod dilated RA, borderline pulm HTN. Since that time, she has agreed to start Xarelto.  She comes in for follow-up doing well. She has not had any falls. She remains active in regular ADLs and also helps care for her husband. She denies any CP, SOB, palpitations, presyncope, syncope or bleeding. She has lost a few pounds unintentionally.   Past Medical History  Diagnosis Date  . Essential hypertension   . Hypothyroidism   . Arthritis   . Stroke [facility](HCC)     a. Dx 2011, 2013. After 2013 stroke patient agreed to be on anticoagulation with Xarelto (had previously refused Coumadin due to husband's history of bleed).  . Chronic atrial fibrillation (HCC)   . Thrombocytopenia (HCC)     a. Noted on labs  . Leukopenia     a. Noted on labs    Past Surgical History  Procedure Laterality Date  . Hernia repair      Current Outpatient Prescriptions  Medication Sig Dispense Refill  . bisoprolol (ZEBETA) 5  MG tablet TAKE 1/2 TABLET BY MOUTH DAILY 7 tablet 0  . latanoprost (XALATAN) 0.005 % ophthalmic solution Place 1 drop into both eyes at bedtime.    Marland Kitchen. levothyroxine (SYNTHROID, LEVOTHROID) 75 MCG tablet Take 75 mcg by mouth daily. Not on Sundays    . lovastatin (MEVACOR) 40 MG tablet Take 40 mg by mouth at bedtime.    . TRAVATAN Z 0.004 % SOLN ophthalmic solution PLACE 1 DROP IN OU HS  0  . Vitamin D, Ergocalciferol, (DRISDOL) 50000 UNITS CAPS Take 50,000 Units by mouth every 7 (seven) days. On Saturdays    . XARELTO 20 MG TABS tablet TAKE 1 TABLET BY MOUTH EVERY DAY WITH DINNER 30 tablet 0   No current facility-administered medications for this visit.    Allergies:   Review of patient's allergies indicates no known allergies.   Social History:  The patient  reports that she has never smoked. She does not have any smokeless tobacco history on file. She reports that she does not drink alcohol or use illicit drugs.   Family History:  The patient's family history includes Heart attack in her mother; Hypertension in her mother; Stroke in her father.  ROS:  Please see the history of present illness.  All other systems are reviewed and otherwise negative.   PHYSICAL EXAM:  VS:  BP 132/75 mmHg  Pulse 77  Ht 5\' 2"  (1.575 m)  Wt 90 lb (40.824 kg)  BMI 16.46 kg/m2 BMI: Body mass index is 16.46  kg/(m^2). Thin elderly WF, in no acute distress, pleasant affect, interactive HEENT: normocephalic, atraumatic Neck: no JVD, carotid bruits or masses Cardiac:  normal S1, S2; irregularly irregular, controlled rate, no murmurs, rubs, or gallops Lungs:  clear to auscultation bilaterally, no wheezing, rhonchi or rales Abd: soft, nontender, no hepatomegaly, + BS MS: no deformity or atrophy Ext: no edema, + chronic varicose veins noted Skin: warm and dry, no rash Neuro:  moves all extremities spontaneously, no focal abnormalities noted other than hard of hearing, follows commands Psych: euthymic mood, full  affect   EKG:  Done today shows atrial fib 77bpm, rightward axis, incomplete RBBB, nonspecific ST-T changes  Recent Labs: No results found for requested labs within last 365 days.  No results found for requested labs within last 365 days.   CrCl cannot be calculated (Patient has no serum creatinine result on file.).   Wt Readings from Last 3 Encounters:  06/21/15 90 lb (40.824 kg)  11/18/14 98 lb (44.453 kg)  06/07/14 94 lb 1.9 oz (42.693 kg)     Other studies reviewed: Additional studies/records reviewed today include: summarized above  ASSESSMENT AND PLAN:  1. Chronic atrial fib - asymptomatic and tolerating current meds. Continue current regimen. Will check BMET today to ensure she is on currect dose of Xarelto - I wouldn't be surprised with her body weight if her GFR requires a dose reduction to  daily.  2. H/o stroke - likely due to AF. Continue anticoagulation. 3. Essential HTN - initial BP was 150/70 with recheck 132/75. Would not make any med changes at this time. 4. Hypothyroidism - she has had recent unintentional weight loss but denies recent labwork. Will check TSH. 5. Thrombocytopenia - this was noted on prior labwork. F/u CBC today.  Disposition: F/u with Dr. Shirlee Latch in 6-8 months. Also advised she discuss unintentional weight loss with PCP.  Current medicines are reviewed at length with the patient today.  The patient did not have any concerns regarding medicines.  Signed, Ronie Spies PA-C 06/21/2015 2:42 PM     [facility] [address] [place] [address] [phone] (office)  [phone] (fax)

## 2015-06-22 ENCOUNTER — Telehealth: Payer: Self-pay

## 2015-06-22 LAB — TSH: TSH: 0.057 u[IU]/mL — AB (ref 0.350–4.500)

## 2015-06-22 MED ORDER — RIVAROXABAN 15 MG PO TABS: 15.0000 mg | ORAL_TABLET | Freq: Every day | ORAL | Status: AC

## 2015-06-22 NOTE — Telephone Encounter (Signed)
Called patient about lab results. Per Ronie Spiesayna Dunn PA, Please call patient. TSH is very low indicating she is on too high of a dose of thyroid medication. I suspect this is probably causing her weight loss - she should contact her PCP today to discuss adjustment of her levothyroxine medication. Her kidney function number (creatinine) is normal but when you adjust this number for her age and weight, her creatinine clearance is 5034ml/min indicating that she should be on the lower dose of Xarelto - 15mg  qsupper. Please prescribe and make patient aware. Her white blood cell count is also continually low but this appears chronic and should be followed by PCP. Otherwise labs are normal. Patient verbalized understanding.

## 2015-07-11 DIAGNOSIS — E039 Hypothyroidism, unspecified: Secondary | ICD-10-CM | POA: Diagnosis not present

## 2015-07-11 DIAGNOSIS — Z0000 Encounter for general adult medical examination without abnormal findings: Secondary | ICD-10-CM | POA: Diagnosis not present

## 2015-07-11 DIAGNOSIS — E785 Hyperlipidemia, unspecified: Secondary | ICD-10-CM | POA: Diagnosis not present

## 2015-07-11 DIAGNOSIS — I10 Essential (primary) hypertension: Secondary | ICD-10-CM | POA: Diagnosis not present

## 2015-07-11 DIAGNOSIS — M8580 Other specified disorders of bone density and structure, unspecified site: Secondary | ICD-10-CM | POA: Diagnosis not present

## 2015-07-11 DIAGNOSIS — E784 Other hyperlipidemia: Secondary | ICD-10-CM | POA: Diagnosis not present

## 2015-07-11 DIAGNOSIS — E559 Vitamin D deficiency, unspecified: Secondary | ICD-10-CM | POA: Diagnosis not present

## 2015-07-14 DIAGNOSIS — R1310 Dysphagia, unspecified: Secondary | ICD-10-CM | POA: Diagnosis not present

## 2015-07-14 DIAGNOSIS — I480 Paroxysmal atrial fibrillation: Secondary | ICD-10-CM | POA: Diagnosis not present

## 2015-07-14 DIAGNOSIS — E784 Other hyperlipidemia: Secondary | ICD-10-CM | POA: Diagnosis not present

## 2015-07-14 DIAGNOSIS — R269 Unspecified abnormalities of gait and mobility: Secondary | ICD-10-CM | POA: Diagnosis not present

## 2015-07-14 DIAGNOSIS — G309 Alzheimer's disease, unspecified: Secondary | ICD-10-CM | POA: Diagnosis not present

## 2015-07-14 DIAGNOSIS — H9193 Unspecified hearing loss, bilateral: Secondary | ICD-10-CM | POA: Diagnosis not present

## 2015-07-14 DIAGNOSIS — I10 Essential (primary) hypertension: Secondary | ICD-10-CM | POA: Diagnosis not present

## 2015-07-14 DIAGNOSIS — Z23 Encounter for immunization: Secondary | ICD-10-CM | POA: Diagnosis not present

## 2015-07-14 DIAGNOSIS — Z0000 Encounter for general adult medical examination without abnormal findings: Secondary | ICD-10-CM | POA: Diagnosis not present

## 2015-07-14 DIAGNOSIS — E038 Other specified hypothyroidism: Secondary | ICD-10-CM | POA: Diagnosis not present

## 2015-09-09 DIAGNOSIS — H01001 Unspecified blepharitis right upper eyelid: Secondary | ICD-10-CM | POA: Diagnosis not present

## 2015-09-09 DIAGNOSIS — H16103 Unspecified superficial keratitis, bilateral: Secondary | ICD-10-CM | POA: Diagnosis not present

## 2015-09-09 DIAGNOSIS — H04123 Dry eye syndrome of bilateral lacrimal glands: Secondary | ICD-10-CM | POA: Diagnosis not present

## 2015-09-09 DIAGNOSIS — H0014 Chalazion left upper eyelid: Secondary | ICD-10-CM | POA: Diagnosis not present

## 2015-09-16 DIAGNOSIS — E038 Other specified hypothyroidism: Secondary | ICD-10-CM | POA: Diagnosis not present

## 2015-09-16 DIAGNOSIS — E039 Hypothyroidism, unspecified: Secondary | ICD-10-CM | POA: Diagnosis not present

## 2015-10-10 DIAGNOSIS — H01001 Unspecified blepharitis right upper eyelid: Secondary | ICD-10-CM | POA: Diagnosis not present

## 2015-10-10 DIAGNOSIS — H353131 Nonexudative age-related macular degeneration, bilateral, early dry stage: Secondary | ICD-10-CM | POA: Diagnosis not present

## 2015-10-10 DIAGNOSIS — Z961 Presence of intraocular lens: Secondary | ICD-10-CM | POA: Diagnosis not present

## 2015-10-10 DIAGNOSIS — H401232 Low-tension glaucoma, bilateral, moderate stage: Secondary | ICD-10-CM | POA: Diagnosis not present

## 2015-11-10 ENCOUNTER — Ambulatory Visit (INDEPENDENT_AMBULATORY_CARE_PROVIDER_SITE_OTHER): Payer: Medicare HMO | Admitting: Podiatry

## 2015-11-10 ENCOUNTER — Encounter: Payer: Self-pay | Admitting: Podiatry

## 2015-11-10 VITALS — BP 78/44 | HR 66 | Resp 16

## 2015-11-10 DIAGNOSIS — M79672 Pain in left foot: Secondary | ICD-10-CM

## 2015-11-10 DIAGNOSIS — M79671 Pain in right foot: Secondary | ICD-10-CM | POA: Diagnosis not present

## 2015-11-10 DIAGNOSIS — Q828 Other specified congenital malformations of skin: Secondary | ICD-10-CM

## 2015-11-10 DIAGNOSIS — B351 Tinea unguium: Secondary | ICD-10-CM | POA: Diagnosis not present

## 2015-11-10 NOTE — Progress Notes (Signed)
   Subjective:    Patient ID: Katelyn [name], female    DOB: June 14, 1922, 82 y.o.   MRN: [id number]  HPI Pt presents with bilateral painful callus and thick nails   Review of Systems  All other systems reviewed and are negative.      Objective:   Physical Exam        Assessment & Plan:

## 2015-11-11 NOTE — Progress Notes (Signed)
Subjective:     Patient ID: Katelyn MarekMary C [name], female   DOB: 10/28/1921, 82 y.o.   MRN: [id number]  HPI patient presents with painful calluses on both feet thick nails that she is unable to take care of herself   Review of Systems  All other systems reviewed and are negative.      Objective:   Physical Exam  Constitutional: She is oriented to person, place, and time.  Cardiovascular: Intact distal pulses.   Musculoskeletal: Normal range of motion.  Neurological: She is oriented to person, place, and time.  Skin: Skin is warm.  Nursing note and vitals reviewed.  neurovascular status found to be intact muscle strength was diminished with range of motion diminished with patient having large structural bunion deformity and severe calluses plantar aspect of both feet with thick nailbeds 1-5 both feet that are impossible for her to cut and are very painful. Patient's found to have adequate perfusion and is well oriented     Assessment:     Significant structural changes with large keratotic lesion formation painful nailbeds and structural deformity    Plan:     H&P and all conditions reviewed with patient. Today I went ahead and debrided nailbeds 1-5 both feet and lesions bilateral with no iatrogenic bleeding noted. Reappoint to recheck

## 2015-11-27 IMAGING — CT CT HEAD W/O CM
4 of 6 series · 14 of 47 positions shown, 16 images · non-contrast
Comparison: Head CT 05/21/2012

CLINICAL DATA: Fall at home with trauma to the back of head.
Patient denies neck pain. Initial encounter.

EXAM:
CT HEAD WITHOUT CONTRAST
CT CERVICAL SPINE WITHOUT CONTRAST
TECHNIQUE: Multidetector CT imaging of the head and cervical spine was
performed following the standard protocol without intravenous
contrast. Multiplanar CT image reconstructions of the cervical spine
were also generated.

[Series 2: head 5.0 h30s · axial · 0.42mm/px · z∈[+267,+317]mm · 2 of 30 slices shown]
[im 10/30  brain]
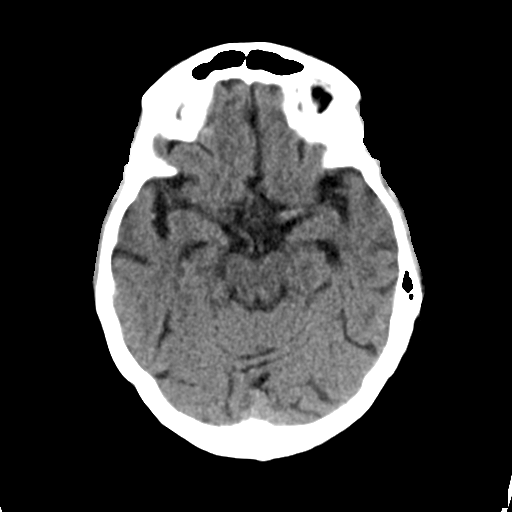
[im 20/30  brain]
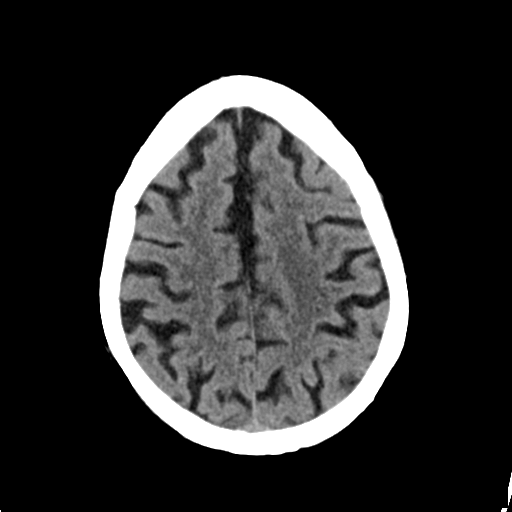

[Series 7: coronals · coronal · 0.23mm/px · 3 of 66 slices shown]
[im 22/66  brain]
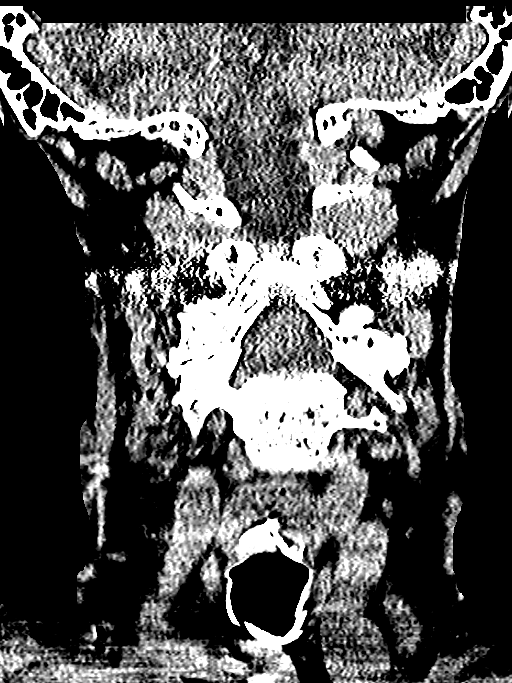
[im 29/66  brain]
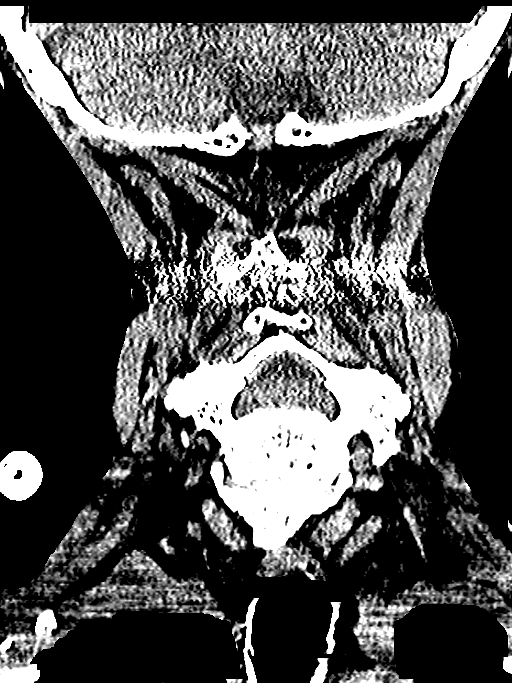
[im 37/66  brain]
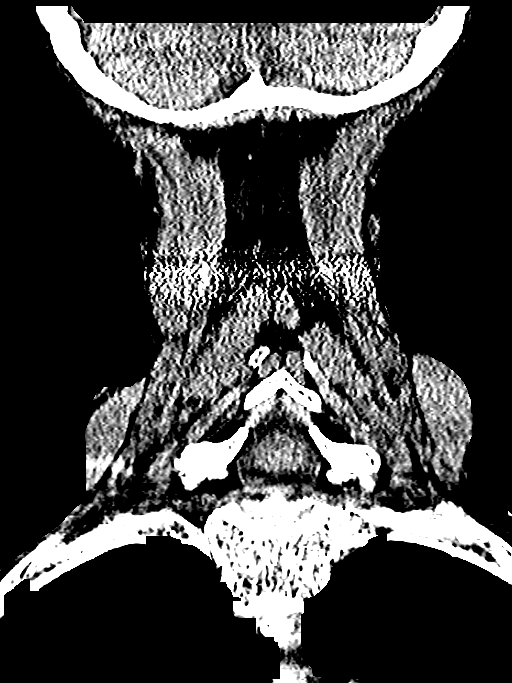

[Series 8: sagittals · sagittal · 0.31mm/px · 3 of 61 slices shown]
[im 21/61  brain]
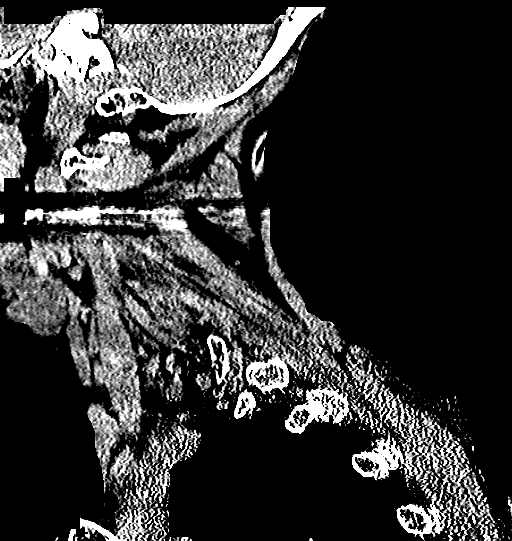
[im 31/61  brain]
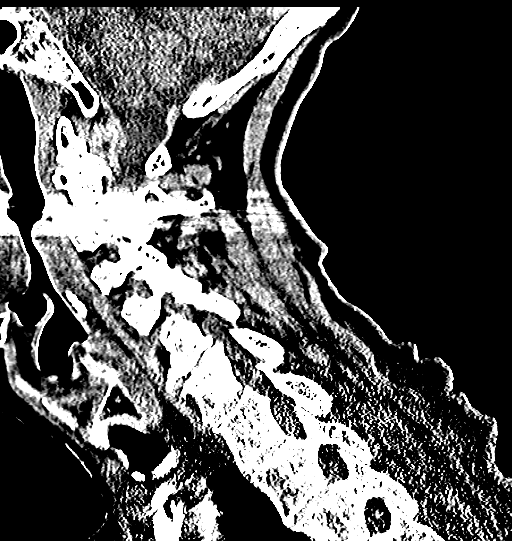
[im 41/61  brain]
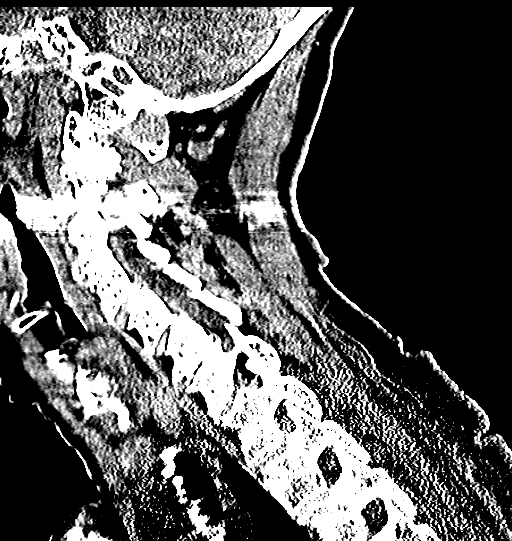

[Series 9: orthogonals upper · axial · 0.23mm/px · z∈[+102,+202]mm · 6 of 72 slices shown, 8 images]
[im 11/72  brain]
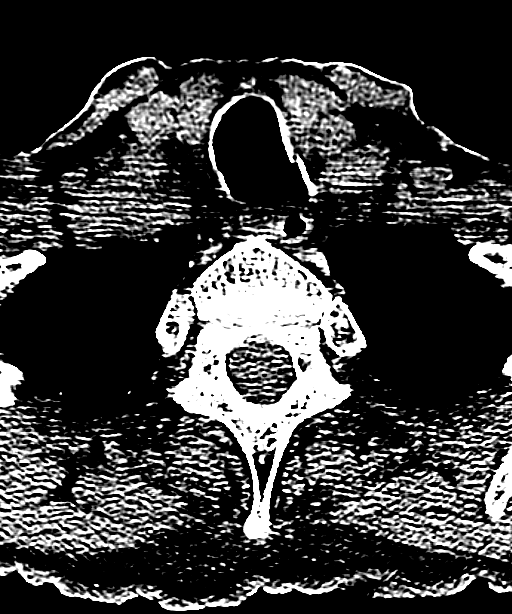
[im 11/72  bone]
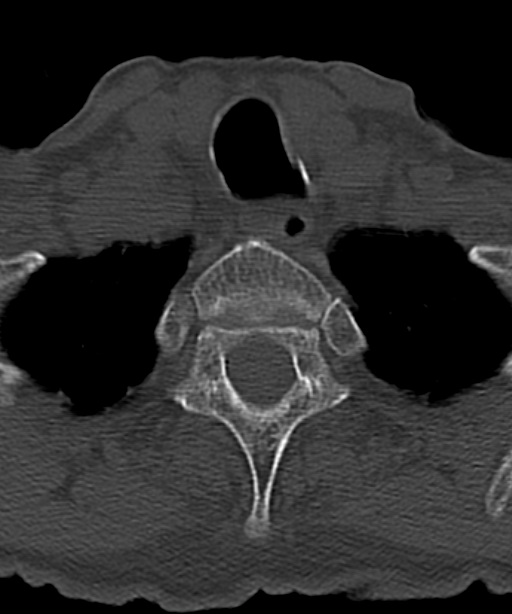
[im 21/72  brain]
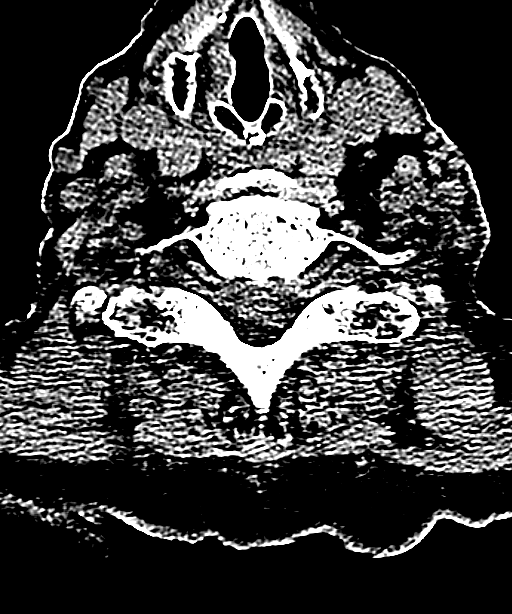
[im 31/72  brain]
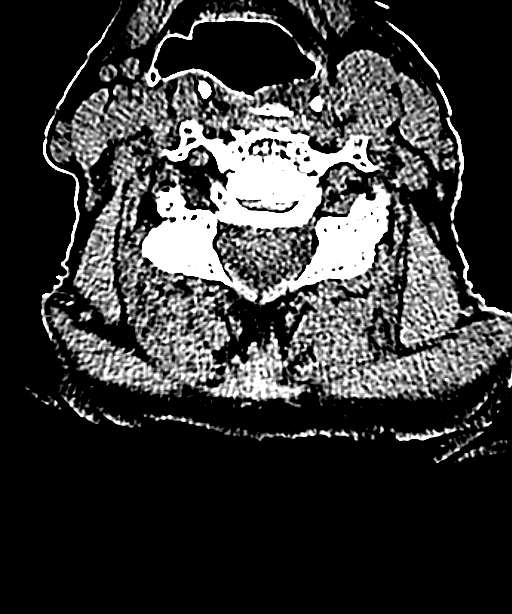
[im 41/72  brain]
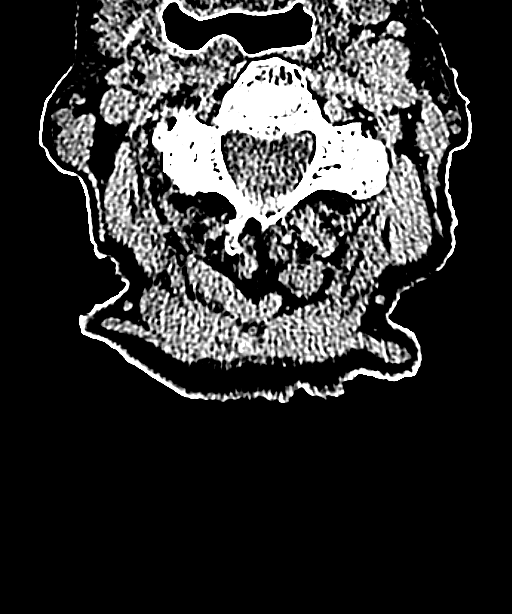
[im 51/72  brain]
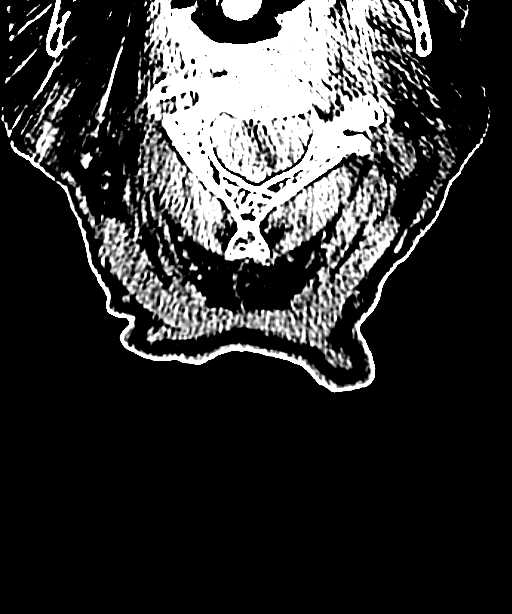
[im 51/72  bone]
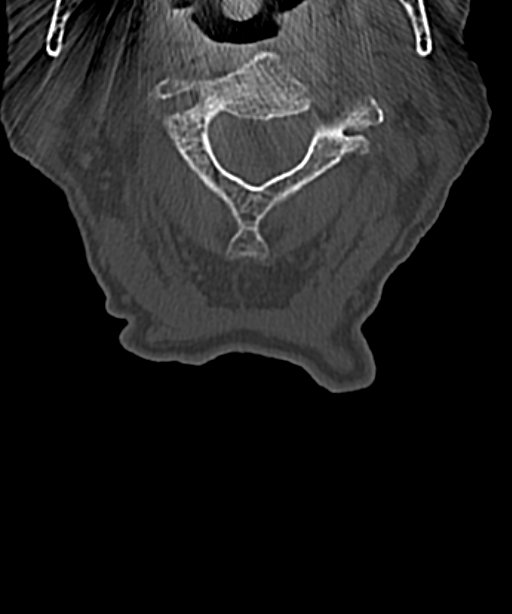
[im 61/72  brain]
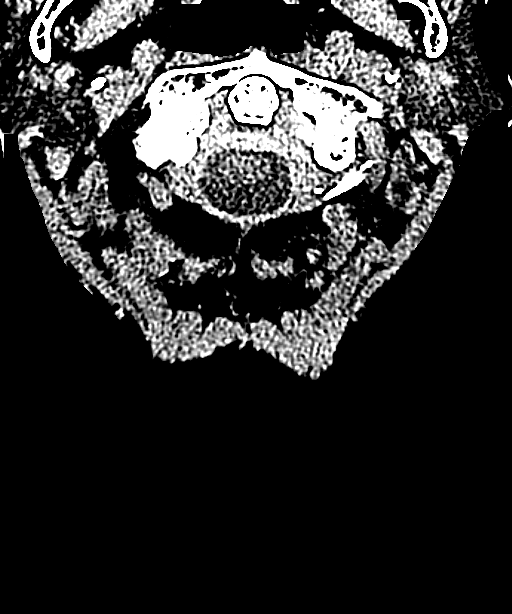

[14 of 47 positions shown; findings below may reference images not displayed]

FINDINGS: CT HEAD FINDINGS

Skull and Sinuses:Right occipital scalp laceration without
underlying fracture.

Orbits: Bilateral cataract resection.  No traumatic findings

Brain: No evidence of acute infarction, hemorrhage, hydrocephalus,
or mass lesion/mass effect. There is generalized brain atrophy which
is mildly progressed from 1238. Chronic small-vessel disease with
ischemic gliosis mainly in the periventricular region. There is a
remote small cortical infarct in the right occipital region.

CT CERVICAL SPINE FINDINGS

No evidence of cervical spine fracture. No suspected traumatic
malalignment (mild C4-5 anterolisthesis was developing in 1440 and
mild C5-6 anterolisthesis is a associated with advanced facet
osteoarthritis, the usual cause). Focal degenerative disc narrowing
and spurring at C6-7. There is no evidence of spinal canal hematoma
or prevertebral edema.
IMPRESSION: 1. No evidence of intracranial injury or cervical spine fracture.
2. Right occipital scalp laceration without fracture.

## 2015-11-28 ENCOUNTER — Other Ambulatory Visit: Payer: Self-pay | Admitting: Internal Medicine

## 2015-11-28 DIAGNOSIS — R1310 Dysphagia, unspecified: Secondary | ICD-10-CM

## 2015-12-06 ENCOUNTER — Ambulatory Visit
Admission: RE | Admit: 2015-12-06 | Discharge: 2015-12-06 | Disposition: A | Discharge status: Home / Self Care | Payer: Medicare HMO | Source: Ambulatory Visit | Attending: Internal Medicine | Admitting: Internal Medicine

## 2015-12-06 DIAGNOSIS — R1310 Dysphagia, unspecified: Secondary | ICD-10-CM | POA: Diagnosis not present

## 2015-12-06 DIAGNOSIS — T17300D Unspecified foreign body in larynx causing asphyxiation, subsequent encounter: Secondary | ICD-10-CM | POA: Diagnosis not present

## 2015-12-16 ENCOUNTER — Ambulatory Visit (INDEPENDENT_AMBULATORY_CARE_PROVIDER_SITE_OTHER): Payer: Medicare HMO

## 2015-12-16 ENCOUNTER — Ambulatory Visit (HOSPITAL_COMMUNITY): Payer: Medicare HMO

## 2015-12-16 ENCOUNTER — Encounter (HOSPITAL_COMMUNITY): Payer: Self-pay | Admitting: *Deleted

## 2015-12-16 ENCOUNTER — Ambulatory Visit (HOSPITAL_COMMUNITY)
Admission: EM | Admit: 2015-12-16 | Discharge: 2015-12-16 | Disposition: A | Discharge status: Home / Self Care | Payer: Medicare HMO | Attending: Emergency Medicine | Admitting: Emergency Medicine

## 2015-12-16 DIAGNOSIS — M25512 Pain in left shoulder: Secondary | ICD-10-CM

## 2015-12-16 DIAGNOSIS — M549 Dorsalgia, unspecified: Secondary | ICD-10-CM | POA: Diagnosis not present

## 2015-12-16 DIAGNOSIS — S3992XA Unspecified injury of lower back, initial encounter: Secondary | ICD-10-CM | POA: Diagnosis not present

## 2015-12-16 DIAGNOSIS — W108XXA Fall (on) (from) other stairs and steps, initial encounter: Secondary | ICD-10-CM

## 2015-12-16 DIAGNOSIS — S4992XA Unspecified injury of left shoulder and upper arm, initial encounter: Secondary | ICD-10-CM | POA: Diagnosis not present

## 2015-12-16 DIAGNOSIS — M545 Low back pain: Secondary | ICD-10-CM | POA: Diagnosis not present

## 2015-12-16 MED ORDER — TRAMADOL HCL 50 MG PO TABS: ORAL_TABLET | ORAL | Status: AC

## 2015-12-16 NOTE — Discharge Instructions (Signed)
So sorry you fell. You have no fractures which is great news. Treat with rest. May use heat if you are supervised. Also biofreeze and aspercreme with lidocaine woorks well for discomfort. Take your stool softer Heat Therapy Heat therapy can help ease sore, stiff, injured, and tight muscles and joints. Heat relaxes your muscles, which may help ease your pain.  RISKS AND COMPLICATIONS If you have any of the following conditions, do not use heat therapy unless your health care provider has approved:  Poor circulation.  Healing wounds or scarred skin in the area being treated.  Diabetes, heart disease, or high blood pressure.  Not being able to feel (numbness) the area being treated.  Unusual swelling of the area being treated.  Active infections.  Blood clots.  Cancer.  Inability to communicate pain. This may include Katelyn [name] children and people who have problems with their brain function (dementia).  Pregnancy. Heat therapy should only be used on old, pre-existing, or long-lasting (chronic) injuries. Do not use heat therapy on new injuries unless directed by your health care provider. HOW TO USE HEAT THERAPY There are several different kinds of heat therapy, including:  Moist heat pack.  Warm water bath.  Hot water bottle.  Electric heating pad.  Heated gel pack.  Heated wrap.  Electric heating pad. Use the heat therapy method suggested by your health care provider. Follow your health care provider's instructions on when and how to use heat therapy. GENERAL HEAT THERAPY RECOMMENDATIONS  Do not sleep while using heat therapy. Only use heat therapy while you are awake.  Your skin may turn pink while using heat therapy. Do not use heat therapy if your skin turns red.  Do not use heat therapy if you have new pain.  High heat or long exposure to heat can cause burns. Be careful when using heat therapy to avoid burning your skin.  Do not use heat therapy on areas of your  skin that are already irritated, such as with a rash or sunburn. SEEK MEDICAL CARE IF:  You have blisters, redness, swelling, or numbness.  You have new pain.  Your pain is worse. MAKE SURE YOU:  Understand these instructions.  Will watch your condition.  Will get help right away if you are not doing well or get worse.   This information is not intended to replace advice given to you by your health care provider. Make sure you discuss any questions you have with your health care provider.   Document Released: 10/22/2011 Document Revised: 08/20/2014 Document Reviewed: 09/22/2013 Elsevier Interactive Patient Education 2016 [employer].  Fall Prevention in Hospitals, Adult As a hospital patient, your condition and the treatments you receive can increase your risk for falls. Some additional risk factors for falls in a hospital include:  Being in an unfamiliar environment.  Being on bed rest.  Your surgery.  Taking certain medicines.  Your tubing requirements, such as intravenous (IV) therapy or catheters. It is important that you learn how to decrease fall risks while at the hospital. Below are important tips that can help prevent falls. SAFETY TIPS FOR PREVENTING FALLS Talk about your risk of falling.  Ask your health care provider why you are at risk for falling. Is it your medicine, illness, tubing placement, or something else?  Make a plan with your health care provider to keep you safe from falls.  Ask your health care provider or pharmacist about side effects of your medicines. Some medicines can make you dizzy or  affect your coordination. Ask for help.  Ask for help before getting out of bed. You may need to press your call button.  Ask for assistance in getting safely to the toilet.  Ask for a walker or cane to be put at your bedside. Ask that most of the side rails on your bed be placed up before your health care provider leaves the room.  Ask family or  friends to sit with you.  Ask for things that are out of your reach, such as your glasses, hearing aids, telephone, bedside table, or call button. Follow these tips to avoid falling:  Stay lying or seated, rather than standing, while waiting for help.  Wear rubber-soled slippers or shoes whenever you walk in the hospital.  Avoid quick, sudden movements.  Change positions slowly.  Sit on the side of your bed before standing.  Stand up slowly and wait before you start to walk.  Let your health care provider know if there is a spill on the floor.  Pay careful attention to the medical equipment, electrical cords, and tubes around you.  When you need help, use your call button by your bed or in the bathroom. Wait for one of your health care providers to help you.  If you feel dizzy or unsure of your footing, return to bed and wait for assistance.  Avoid being distracted by the TV, telephone, or another person in your room.  Do not lean or support yourself on rolling objects, such as IV poles or bedside tables.   This information is not intended to replace advice given to you by your health care provider. Make sure you discuss any questions you have with your health care provider.   Document Released: 07/27/2000 Document Revised: 08/20/2014 Document Reviewed: 04/06/2012 Elsevier Interactive Patient Education [employer] Inc.  daily for bowel that we saw on xrays. Otherwise be careful and f/u with your PCP if needed.

## 2015-12-16 NOTE — ED Notes (Signed)
Pt  Reports  She  Felled       yest   Lost  Her balance   And  Injured   Her  l  Hip  And  Shoulder     She  Did  Not  Black   Out     She  Remembers   Everything     -      She  Reports   Pain on  Weight bearing         Her  Gait is  Unsteady       Pt     Reports   Symptoms not releived  By  Tylenol   Her  Daughter    Is  At  Bedside

## 2015-12-16 NOTE — ED Provider Notes (Signed)
CSN: [id number]     Arrival date & time 12/16/15  1309 History   First MD Initiated Contact with Patient 12/16/15 1341     Chief Complaint  Patient presents with  . Fall   (Consider location/radiation/quality/duration/timing/severity/associated sxs/prior Treatment) HPI Comments: Katelyn [name] is a 82 yo female who presents today with pain following a fall yesterday. Katelyn [name] reports losing her balance coming down the stairs at a friends home. Katelyn [name] landed on her left side. Katelyn [name] did not hit her head. Katelyn [name] was not dizzy and did not lose LOC. No syncope was witnessed. Katelyn [name] has not been able to walk well since the fall. Her left shoulder and left buttock region are painful. Worsened today.   Patient is a 82 y.o. female presenting with fall. The history is provided by the patient.  Fall Pertinent negatives include no headaches.    Past Medical History  Diagnosis Date  . Essential hypertension   . Hypothyroidism   . Arthritis   . Stroke [facility](HCC)     a. Dx 2011, 2013. After 2013 stroke patient agreed to be on anticoagulation with Xarelto (had previously refused Coumadin due to husband's history of bleed).  . Chronic atrial fibrillation (HCC)   . Thrombocytopenia (HCC)     a. Noted on labs  . Leukopenia     a. Noted on labs   Past Surgical History  Procedure Laterality Date  . Hernia repair     Family History  Problem Relation Age of Onset  . Heart attack Mother   . Hypertension Mother   . Stroke Father    Social History  Substance Use Topics  . Smoking status: Never Smoker   . Smokeless tobacco: None  . Alcohol Use: No   OB History    No data available     Review of Systems  Musculoskeletal: Positive for back pain and arthralgias. Negative for joint swelling, neck pain and neck stiffness.  Neurological: Negative for dizziness, weakness, light-headedness, numbness and headaches.    Allergies  Review of patient's allergies indicates no known allergies.  Home Medications   Prior to  Admission medications   Medication Sig Start Date End Date Taking? Authorizing Provider  bisoprolol (ZEBETA) 5 MG tablet Take 0.5 tablets (2.5 mg total) by mouth daily. 06/21/15   Dayna N Dunn, PA-C  latanoprost (XALATAN) 0.005 % ophthalmic solution Place 1 drop into both eyes at bedtime. 06/09/14   Laurey Moralealton S McLean, MD  levothyroxine (SYNTHROID, LEVOTHROID) 75 MCG tablet Take 75 mcg by mouth daily. Not on Sundays    Historical Provider, MD  lovastatin (MEVACOR) 40 MG tablet Take 40 mg by mouth at bedtime.    Historical Provider, MD  Rivaroxaban (XARELTO) 15 MG TABS tablet Take 1 tablet (15 mg total) by mouth daily with supper. 06/22/15   Dayna N Dunn, PA-C  traMADol (ULTRAM) 50 MG tablet 1/2 tablet as needed for severe pain every 8-12 hours 12/16/15   Riki SheerMichelle G Young, PA-C  TRAVATAN Z 0.004 % SOLN ophthalmic solution PLACE 1 DROP IN OU HS 06/12/15   Historical Provider, MD  Vitamin D, Ergocalciferol, (DRISDOL) 50000 UNITS CAPS Take 50,000 Units by mouth every 7 (seven) days. On Saturdays    Historical Provider, MD   Meds Ordered and Administered this Visit  Medications - No data to display  BP 90/64 mmHg  Pulse 72  Temp(Src) 98.6 F (37 C) (Oral)  Resp 18 No data found.   Physical Exam  Constitutional: Katelyn [name] is oriented to person, place,  and time. Katelyn [name] appears well-developed and well-nourished. No distress.  HENT:  Head: Normocephalic and atraumatic.  Eyes: Pupils are equal, round, and reactive to light.  Neck: Normal range of motion. Neck supple.  ROM limited without pain (appears chronic). Mild kyphosis is noted, no ecchymosis or tenderness to palpation  Abdominal: Soft. There is no tenderness. There is no rebound and no guarding.  Musculoskeletal: Katelyn [name] exhibits edema and tenderness.  Left hip without ecchymosis. Full ROM without pain. Left lower lumbar spine tender to palpation along spine and left para spinal muscles. No spasm or mass. Left shoulder with decreased and painful ROM.  Tenderness along the humeral head. No effusions. Gait limited secondary to pain. In wheelchair  Neurological: Katelyn [name] is alert and oriented to person, place, and time. No cranial nerve deficit. Katelyn [name] exhibits normal muscle tone. Coordination normal.  Skin: Skin is warm and dry. No rash noted. Katelyn [name] is not diaphoretic.  Psychiatric: Her behavior is normal. Thought content normal.  Nursing note and vitals reviewed.   ED Course  Procedures (including critical care time)  Labs Review Labs Reviewed - No data to display  Imaging Review Dg Lumbar Spine Complete  12/16/2015  CLINICAL DATA:  Pain following fall EXAM: LUMBAR SPINE - COMPLETE 4+ VIEW COMPARISON:  None. FINDINGS: Frontal, lateral, spot lumbosacral lateral, and bilateral oblique views were obtained. There are 5 non-rib-bearing lumbar type vertebral bodies. There is mid lumbar dextroscoliosis with a rotatory component. There is no acute fracture or spondylolisthesis. There is disc space narrowing at all levels, moderately severe. There is facet osteoarthritic change at all levels bilaterally. There is moderate atherosclerotic calcification in the aorta and common iliac arteries. There are foci of contrast in multiple colonic diverticula. IMPRESSION: Multilevel arthropathy. Scoliosis. No acute fracture or spondylolisthesis. Atherosclerotic calcification noted. Electronically Signed   By: Bretta Bang III M.D.   On: 12/16/2015 14:52   Dg Shoulder Left  12/16/2015  CLINICAL DATA:  Fall yesterday landing on the left shoulder. Left shoulder pain. Initial encounter. EXAM: LEFT SHOULDER - 2+ VIEW COMPARISON:  None. FINDINGS: There is no evidence of fracture or dislocation. Osteopenia. Mild acromioclavicular spurring. IMPRESSION: No acute finding. Electronically Signed   By: Marnee Spring M.D.   On: 12/16/2015 14:35     Visual Acuity Review  Right Eye Distance:   Left Eye Distance:   Bilateral Distance:    Right Eye Near:   Left Eye Near:     Bilateral Near:         MDM   1. Back pain, acute   2. Left shoulder pain   3. Fall (on) (from) other stairs and steps, initial encounter    Patient stable without fractures. Suggest rest, heat/ice use of topicals such as bio freeze. May take Tylenol as directed or Tramadol only if moderate pain (1/2 dose) with supervision (daughter aware). IF worsens then please keep f/u with PCP. Suggest use of stool softer daily with lots of water.     Riki Sheer, PA-C 12/16/15 [phone]

## 2015-12-23 ENCOUNTER — Encounter: Payer: Self-pay | Admitting: Podiatry

## 2015-12-23 ENCOUNTER — Ambulatory Visit (INDEPENDENT_AMBULATORY_CARE_PROVIDER_SITE_OTHER): Payer: Medicare HMO | Admitting: Podiatry

## 2015-12-23 VITALS — BP 150/82 | HR 58 | Temp 97.0°F | Resp 16

## 2015-12-23 DIAGNOSIS — L02619 Cutaneous abscess of unspecified foot: Secondary | ICD-10-CM

## 2015-12-23 DIAGNOSIS — L03119 Cellulitis of unspecified part of limb: Secondary | ICD-10-CM

## 2015-12-23 MED ORDER — AMOXICILLIN-POT CLAVULANATE 875-125 MG PO TABS: 1.0000 | ORAL_TABLET | Freq: Two times a day (BID) | ORAL | Status: AC

## 2015-12-23 NOTE — Progress Notes (Signed)
Subjective:     Patient ID: Lorayne MarekMary C Bilbo, female   DOB: [birth year], 82 y.o.   MRN: [id number]  HPI patient presents stating in the last few days that she's developed redness and irritation of the callus that she gets on her left arch area may have bumped it   Review of Systems     Objective:   Physical Exam Neurovascular status unchanged with normal temperature noted and no systemic signs of infection. There is redness surrounding the area and slight warmth increase into the left lower leg but no streaking is noted. Patient states she feels fine and does not feel like she has any type of infection or systemic process. The area itself measures about 2 x 2 centimeters and does have slight drainage noted but it again is localized with no abscess formation    Assessment:     Cellulitic event of the left arch secondary to probable trauma that is localized in nature    Plan:     I reviewed this with caregiver great length and I stated if any indications of systemic changes were to occur she is to go to the emergency room. Today using sharp sterile his mentation I debrided the area I took a culture and I then flushed and applied sterile dressing to reduce the cellulitic tissue. I placed on Augmentin 875 twice a day and instructed on soaks and gave strict instructions again to call us or go to the emergency room if any changes should occur and will be seen back by Dr. Ardelle AntonWagoner in 1 week

## 2015-12-27 LAB — WOUND CULTURE

## 2015-12-30 ENCOUNTER — Ambulatory Visit (HOSPITAL_COMMUNITY)
Admission: RE | Admit: 2015-12-30 | Discharge: 2015-12-30 | Disposition: A | Discharge status: Home / Self Care | Payer: Medicare HMO | Source: Ambulatory Visit | Attending: Vascular Surgery | Admitting: Vascular Surgery

## 2015-12-30 ENCOUNTER — Encounter: Payer: Self-pay | Admitting: Podiatry

## 2015-12-30 ENCOUNTER — Telehealth: Payer: Self-pay | Admitting: *Deleted

## 2015-12-30 ENCOUNTER — Other Ambulatory Visit: Payer: Self-pay | Admitting: *Deleted

## 2015-12-30 ENCOUNTER — Ambulatory Visit (INDEPENDENT_AMBULATORY_CARE_PROVIDER_SITE_OTHER): Payer: Medicare HMO | Admitting: Podiatry

## 2015-12-30 VITALS — BP 125/68 | HR 52 | Resp 18

## 2015-12-30 DIAGNOSIS — R609 Edema, unspecified: Secondary | ICD-10-CM

## 2015-12-30 DIAGNOSIS — M79605 Pain in left leg: Secondary | ICD-10-CM | POA: Diagnosis not present

## 2015-12-30 DIAGNOSIS — L03119 Cellulitis of unspecified part of limb: Secondary | ICD-10-CM

## 2015-12-30 DIAGNOSIS — L97521 Non-pressure chronic ulcer of other part of left foot limited to breakdown of skin: Secondary | ICD-10-CM

## 2015-12-30 DIAGNOSIS — L02619 Cutaneous abscess of unspecified foot: Secondary | ICD-10-CM

## 2015-12-30 MED ORDER — CLINDAMYCIN HCL 300 MG PO CAPS: 300.0000 mg | ORAL_CAPSULE | Freq: Three times a day (TID) | ORAL | Status: DC

## 2015-12-30 NOTE — Telephone Encounter (Addendum)
Left message for Triage Nurse - [facility] to call to schedule STAT venous doppler left leg.  Olegario MessierKathy - [facility], states pt negative for DVT, asked if she could inform pt, I told her yes.  Pt's dtr-in-law, Dannea came to the office and ask that we not contact pt it will just confuse pt and her husband, but ask if there were other instructions.  Dr. Ardelle AntonWagoner ordered continue antibiotic, and gave the instructions to [facility].

## 2016-01-03 NOTE — Progress Notes (Signed)
Patient ID: Katelyn [name], female   DOB: [birth year], 82 y.o.   MRN: [id number]  Subjective: Anest[age]-year-old female presents the office today for follow-up evaluation of a wound on the left foot. She is continued with Augmentin. She presents with a family member who states that her left leg has become more swollen and somewhat red over the last couple of days as well. Denies any systemic complaints such as fevers, chills, nausea, vomiting. No acute changes since last appointment, and no other complaints at this time.   Objective: AAO x3, NAD DP/PT pulses palpable bilaterally, CRT less than 3 seconds On the plantar medial aspect of the left arch the foot the hyperkeratotic lesion with underlying ulceration. The lesion measures approximately 1.8 x 1.8 cm prior to debridement and after debridement there is a superficial granular wound without any drainage or pus. There is some mild edema to the foot however there is no erythema or increase in warmth. The leg does have some swelling and a faint redness the anterior aspect the leg. There is a small amount of weeping present in the leg due to the swelling. There is no pus. There is only clear fluid expressed. No areas of pinpoint bony tenderness or pain with vibratory sensation.  No edema, erythema, increase in warmth to bilateral lower extremities.  No open lesions or pre-ulcerative lesions.  No pain with calf compression, swelling, warmth, erythema  Assessment: Left leg ulceration with increased leg swelling, redness  Plan: -All treatment options discussed with the patient including all alternatives, risks, complications.  -Due to the swelling of the left calf venous duplex which she will get when she leaves the office today. Contacted vein and vascular for this. -Will switch to clindamycin. Culture results were reviewed with the patient. -Wound was debrided on the left foot. -Lite compression applied to the left leg. -If there is any signs or  symptoms of worsening infection she is to go immediately to the emergency room. Otherwise follow-up in 1 week. -Patient encouraged to call the office with any questions, concerns, change in symptoms.   Katelyn [name], DPM

## 2016-01-04 ENCOUNTER — Telehealth: Payer: Self-pay | Admitting: *Deleted

## 2016-01-04 NOTE — Telephone Encounter (Signed)
Katelyn [name] states pt began to get upper GI upset yesterday evening, does she need to continue the antibiotic.  Katelyn states she has opened the pill and given in pudding a couple of times and now feels that may be the culprit.  I told Katelyn to hold this evenings dose, but begin tomorrow in the morning with food not in food and keep the appt with Dr. Ardelle AntonWagoner 01/05/2016.  Katelyn states the leg looks good.

## 2016-01-04 NOTE — Telephone Encounter (Signed)
She can hold until I see her tomorrow

## 2016-01-05 ENCOUNTER — Ambulatory Visit (INDEPENDENT_AMBULATORY_CARE_PROVIDER_SITE_OTHER): Payer: Medicare HMO | Admitting: Podiatry

## 2016-01-05 DIAGNOSIS — L03119 Cellulitis of unspecified part of limb: Secondary | ICD-10-CM

## 2016-01-05 DIAGNOSIS — L02619 Cutaneous abscess of unspecified foot: Secondary | ICD-10-CM | POA: Diagnosis not present

## 2016-01-05 DIAGNOSIS — L97521 Non-pressure chronic ulcer of other part of left foot limited to breakdown of skin: Secondary | ICD-10-CM

## 2016-01-05 MED ORDER — CEPHALEXIN 500 MG PO CAPS: 500.0000 mg | ORAL_CAPSULE | Freq: Three times a day (TID) | ORAL | Status: AC

## 2016-01-05 MED ORDER — CEPHALEXIN 500 MG PO CAPS: 500.0000 mg | ORAL_CAPSULE | Freq: Three times a day (TID) | ORAL | Status: DC

## 2016-01-05 NOTE — Patient Instructions (Signed)
Monitor for any signs/symptoms of infection. If there are any signs of symptoms of worsening infection go to the ER. Call with any questions/concerns.

## 2016-01-06 ENCOUNTER — Ambulatory Visit: Payer: Medicare HMO | Admitting: Podiatry

## 2016-01-11 ENCOUNTER — Encounter: Payer: Self-pay | Admitting: Podiatry

## 2016-01-11 ENCOUNTER — Ambulatory Visit (INDEPENDENT_AMBULATORY_CARE_PROVIDER_SITE_OTHER): Payer: Medicare HMO | Admitting: Podiatry

## 2016-01-11 DIAGNOSIS — L84 Corns and callosities: Secondary | ICD-10-CM | POA: Diagnosis not present

## 2016-01-11 DIAGNOSIS — L97521 Non-pressure chronic ulcer of other part of left foot limited to breakdown of skin: Secondary | ICD-10-CM

## 2016-01-11 NOTE — Progress Notes (Signed)
Patient ID: Lorayne MarekMary C Mcconaughy, female   DOB: 08/06/1922, 82 y.o.   MRN: [id number]  Subjective: 82 year old female presents the office today for follow-up evaluation of a wound on the left foot. She presents today with family member who states that that she is doing much better. Denies any swelling or redness to the left leg and no drainage or pus or redness around the previous wound on the left foot. She gets calluses to both feet which she is concerned may spread infection. She's finished antibiotics. Denies any systemic complaints such as fevers, chills, nausea, vomiting. No acute changes since last appointment, and no other complaints at this time.   Objective: AAO x3, NAD DP/PT pulses palpable bilaterally, CRT less than 3 seconds On the plantar medial aspect of the left arch the foot the hyperkeratotic lesion with no underlying ulceration and the wound appears to be healed. There is no ascending redness or swelling or red streaks. No drainage or pus. Pre-ulcerative hyperkeratotic lesion left fourth toe and right second metatarsal 1. No underlying ulceration. There is significantly improved edema and erythema to the left leg and there is currently no symptoms at this time. No drainage or pus. No open lesions or pre-ulcerative lesions.  No pain with calf compression, swelling, warmth, erythema  Assessment: Resolved ulceration, cellulitis  Plan: -All treatment options discussed with the patient including all alternatives, risks, complications.  -At this time the patient's family member been not limited to trim the calluses have the did not appear to be any underlying ulceration and the callouses were thin. Given the previous wound infection the left foot with pre-ulcerative calluses I have recommended an orthotic to help accommodate pre-ulcerative lesions and to help prevent further problems. She was molded today for inserts. Encouraged insurance coverage to cover for medical necessity. -Follow-up in  4 weeks or sooner if needed. Monitor for recurrence of infection the open sores.  Ovid CurdMatthew Wendall Isabell, DPM

## 2016-01-12 NOTE — Progress Notes (Signed)
Patient ID: Katelyn MarekMary C [name], female   DOB: August 10, 1922, 82 y.o.   MRN: [id number]  Subjective: 82 year old female presents the office today for follow-up evaluation of a wound on the left foot. She still has any redness or drainage around the wound and the swelling to the left leg has significantly improved. There is still some residual redness to the leg however it has decreased. Denies any systemic complaints such as fevers, chills, nausea, vomiting. No acute changes since last appointment, and no other complaints at this time.   Objective: AAO x3, NAD DP/PT pulses palpable bilaterally, CRT less than 3 seconds On the plantar medial aspect of the left arch the foot the hyperkeratotic lesion. Upon debridement there is no underlying ulceration. There is decreased edema and erythema to the left leg and foot however does remain somewhat. There is no increase in warmth. No fluctuance or crepitus. No drainage or pus. No other open sores identified. No open lesions or pre-ulcerative lesions.  No pain with calf compression, swelling, warmth, erythema  Assessment: Left leg resolving ulceration and cellulitis  Plan: -All treatment options discussed with the patient including all alternatives, risks, complications.  -Pre-ulcerative lesion was debrided. Wound appears be healed and the infection appears to be clear. We'll continue Keflex for 1 week given the residual redness and swelling however this does appear to be much improved. Continue to monitor for any signs or symptoms of worsening infection to the ER should any occur. I'll see her back in 1 week to recheck or sooner if any issues are to arise. -Patient encouraged to call the office with any questions, concerns, change in symptoms.   Ovid CurdMatthew Antania Hoefling, DPM

## 2016-01-18 DIAGNOSIS — G309 Alzheimer's disease, unspecified: Secondary | ICD-10-CM | POA: Diagnosis not present

## 2016-01-18 DIAGNOSIS — R2689 Other abnormalities of gait and mobility: Secondary | ICD-10-CM | POA: Diagnosis not present

## 2016-01-18 DIAGNOSIS — Z6821 Body mass index (BMI) 21.0-21.9, adult: Secondary | ICD-10-CM | POA: Diagnosis not present

## 2016-01-18 DIAGNOSIS — R296 Repeated falls: Secondary | ICD-10-CM | POA: Diagnosis not present

## 2016-01-18 DIAGNOSIS — I83893 Varicose veins of bilateral lower extremities with other complications: Secondary | ICD-10-CM | POA: Diagnosis not present

## 2016-01-18 DIAGNOSIS — L03115 Cellulitis of right lower limb: Secondary | ICD-10-CM | POA: Diagnosis not present

## 2016-01-23 DIAGNOSIS — R296 Repeated falls: Secondary | ICD-10-CM | POA: Diagnosis not present

## 2016-01-23 DIAGNOSIS — I83893 Varicose veins of bilateral lower extremities with other complications: Secondary | ICD-10-CM | POA: Diagnosis not present

## 2016-01-23 DIAGNOSIS — Z6821 Body mass index (BMI) 21.0-21.9, adult: Secondary | ICD-10-CM | POA: Diagnosis not present

## 2016-01-23 DIAGNOSIS — L03115 Cellulitis of right lower limb: Secondary | ICD-10-CM | POA: Diagnosis not present

## 2016-01-24 DIAGNOSIS — I10 Essential (primary) hypertension: Secondary | ICD-10-CM | POA: Diagnosis not present

## 2016-01-24 DIAGNOSIS — G309 Alzheimer's disease, unspecified: Secondary | ICD-10-CM | POA: Diagnosis not present

## 2016-01-24 DIAGNOSIS — I872 Venous insufficiency (chronic) (peripheral): Secondary | ICD-10-CM | POA: Diagnosis not present

## 2016-01-24 DIAGNOSIS — L03115 Cellulitis of right lower limb: Secondary | ICD-10-CM | POA: Diagnosis not present

## 2016-01-24 DIAGNOSIS — R69 Illness, unspecified: Secondary | ICD-10-CM | POA: Diagnosis not present

## 2016-01-24 DIAGNOSIS — G308 Other Alzheimer's disease: Secondary | ICD-10-CM | POA: Diagnosis not present

## 2016-01-24 DIAGNOSIS — M1990 Unspecified osteoarthritis, unspecified site: Secondary | ICD-10-CM | POA: Diagnosis not present

## 2016-01-24 DIAGNOSIS — I4891 Unspecified atrial fibrillation: Secondary | ICD-10-CM | POA: Diagnosis not present

## 2016-01-24 DIAGNOSIS — I739 Peripheral vascular disease, unspecified: Secondary | ICD-10-CM | POA: Diagnosis not present

## 2016-02-02 DIAGNOSIS — I480 Paroxysmal atrial fibrillation: Secondary | ICD-10-CM | POA: Diagnosis not present

## 2016-02-02 DIAGNOSIS — E784 Other hyperlipidemia: Secondary | ICD-10-CM | POA: Diagnosis not present

## 2016-02-02 DIAGNOSIS — R296 Repeated falls: Secondary | ICD-10-CM | POA: Diagnosis not present

## 2016-02-02 DIAGNOSIS — R269 Unspecified abnormalities of gait and mobility: Secondary | ICD-10-CM | POA: Diagnosis not present

## 2016-02-02 DIAGNOSIS — I83893 Varicose veins of bilateral lower extremities with other complications: Secondary | ICD-10-CM | POA: Diagnosis not present

## 2016-02-02 DIAGNOSIS — Z6821 Body mass index (BMI) 21.0-21.9, adult: Secondary | ICD-10-CM | POA: Diagnosis not present

## 2016-02-06 ENCOUNTER — Ambulatory Visit: Payer: Medicare HMO | Admitting: Podiatry

## 2016-02-13 ENCOUNTER — Ambulatory Visit: Payer: Medicare HMO

## 2016-02-27 ENCOUNTER — Ambulatory Visit: Payer: Medicare HMO | Admitting: Podiatry

## 2016-03-01 DIAGNOSIS — Z681 Body mass index (BMI) 19 or less, adult: Secondary | ICD-10-CM | POA: Diagnosis not present

## 2016-03-01 DIAGNOSIS — D649 Anemia, unspecified: Secondary | ICD-10-CM | POA: Diagnosis not present

## 2016-03-01 DIAGNOSIS — D7589 Other specified diseases of blood and blood-forming organs: Secondary | ICD-10-CM | POA: Diagnosis not present

## 2016-03-01 DIAGNOSIS — D6489 Other specified anemias: Secondary | ICD-10-CM | POA: Diagnosis not present

## 2016-03-01 DIAGNOSIS — R634 Abnormal weight loss: Secondary | ICD-10-CM | POA: Diagnosis not present

## 2016-03-01 DIAGNOSIS — I10 Essential (primary) hypertension: Secondary | ICD-10-CM | POA: Diagnosis not present

## 2016-03-01 DIAGNOSIS — Z961 Presence of intraocular lens: Secondary | ICD-10-CM | POA: Diagnosis not present

## 2016-03-01 DIAGNOSIS — R296 Repeated falls: Secondary | ICD-10-CM | POA: Diagnosis not present

## 2016-03-01 DIAGNOSIS — E038 Other specified hypothyroidism: Secondary | ICD-10-CM | POA: Diagnosis not present

## 2016-03-01 DIAGNOSIS — I480 Paroxysmal atrial fibrillation: Secondary | ICD-10-CM | POA: Diagnosis not present

## 2016-03-01 DIAGNOSIS — H401233 Low-tension glaucoma, bilateral, severe stage: Secondary | ICD-10-CM | POA: Diagnosis not present

## 2016-03-12 ENCOUNTER — Encounter: Payer: Self-pay | Admitting: Podiatry

## 2016-03-12 ENCOUNTER — Ambulatory Visit (INDEPENDENT_AMBULATORY_CARE_PROVIDER_SITE_OTHER): Payer: Medicare HMO | Admitting: Podiatry

## 2016-03-12 DIAGNOSIS — B351 Tinea unguium: Secondary | ICD-10-CM | POA: Diagnosis not present

## 2016-03-12 DIAGNOSIS — M79671 Pain in right foot: Secondary | ICD-10-CM

## 2016-03-12 DIAGNOSIS — M79672 Pain in left foot: Secondary | ICD-10-CM

## 2016-03-12 NOTE — Patient Instructions (Addendum)

## 2016-03-18 NOTE — Progress Notes (Signed)
Patient ID: Lorayne MarekMary C Mcnutt, female   DOB: 18-Oct-1921, 82 y.o.   MRN: [id number]  Subjective: 82 year old female presents the office today to pick up orthotics. She states that after her last one with me she did see her primary care doctor and just change in antibiotics and she was started on compression socks which helped. The patient's daughter states that she is very add-on that she does not want have any calluses trimmed today. However the nails are elongated causing discomfort. Denies any redness or swelling to her feet.  Objective: AAO x3, NAD DP/PT pulses palpable bilaterally, CRT less than 3 seconds Nails are hypertrophic, dystrophic, brittle, discolored, elongated 10. No surrounding redness or drainage. Tenderness nails 1-5 bilaterally. Thick hyperkeratotic lesion left renal arch. There does be re-some blood underneath the callus. There is no surrounding redness or drainage or ascending cellulitis. Small hyperkeratotic lesion on the right foot.  No open lesions or pre-ulcerative lesions.  No pain with calf compression, swelling, warmth, erythema  Assessment: Symptomatic onychomycosis  Plan: -All treatment options discussed with the patient including all alternatives, risks, complications.  -Nails debrided 10 without couple complications or bleeding. -I strongly recommended the patient and her daughter that I briefly calluses only slightly given the blood underneath the callus and they're very thick to get the left medial arch. The patient's daughter is very adamant about not having them trimmed. Discussed it could be a wound underneath the area which could result in infection by itself. They declined. -Without his were dispensed. Oral and written break in instructions were discussed. These were made in order to help offload synthetic hyperkeratotic lesions to help minimize skin breakdown. Monitor of the foot daily. This any further skin breakdown or irritation to call the office  immediately.  Ovid CurdMatthew Barclay Lennox, DPM

## 2016-04-20 DIAGNOSIS — Z681 Body mass index (BMI) 19 or less, adult: Secondary | ICD-10-CM | POA: Diagnosis not present

## 2016-04-20 DIAGNOSIS — L84 Corns and callosities: Secondary | ICD-10-CM | POA: Diagnosis not present

## 2016-04-20 DIAGNOSIS — I83893 Varicose veins of bilateral lower extremities with other complications: Secondary | ICD-10-CM | POA: Diagnosis not present

## 2016-04-20 DIAGNOSIS — R69 Illness, unspecified: Secondary | ICD-10-CM | POA: Diagnosis not present

## 2016-04-26 ENCOUNTER — Other Ambulatory Visit: Payer: Self-pay | Admitting: Physician Assistant

## 2016-04-26 DIAGNOSIS — M76821 Posterior tibial tendinitis, right leg: Secondary | ICD-10-CM | POA: Diagnosis not present

## 2016-04-26 DIAGNOSIS — M76822 Posterior tibial tendinitis, left leg: Secondary | ICD-10-CM | POA: Diagnosis not present

## 2016-05-09 DIAGNOSIS — M76821 Posterior tibial tendinitis, right leg: Secondary | ICD-10-CM | POA: Diagnosis not present

## 2016-05-09 DIAGNOSIS — M76822 Posterior tibial tendinitis, left leg: Secondary | ICD-10-CM | POA: Diagnosis not present

## 2016-06-04 DIAGNOSIS — I739 Peripheral vascular disease, unspecified: Secondary | ICD-10-CM | POA: Diagnosis not present

## 2016-06-04 DIAGNOSIS — L84 Corns and callosities: Secondary | ICD-10-CM | POA: Diagnosis not present

## 2016-06-04 DIAGNOSIS — L602 Onychogryphosis: Secondary | ICD-10-CM | POA: Diagnosis not present

## 2016-07-10 DIAGNOSIS — R69 Illness, unspecified: Secondary | ICD-10-CM | POA: Diagnosis not present

## 2016-07-19 DIAGNOSIS — H0014 Chalazion left upper eyelid: Secondary | ICD-10-CM | POA: Diagnosis not present

## 2016-07-19 DIAGNOSIS — H04123 Dry eye syndrome of bilateral lacrimal glands: Secondary | ICD-10-CM | POA: Diagnosis not present

## 2016-07-19 DIAGNOSIS — H16103 Unspecified superficial keratitis, bilateral: Secondary | ICD-10-CM | POA: Diagnosis not present

## 2016-07-19 DIAGNOSIS — H401233 Low-tension glaucoma, bilateral, severe stage: Secondary | ICD-10-CM | POA: Diagnosis not present

## 2016-08-15 DIAGNOSIS — L84 Corns and callosities: Secondary | ICD-10-CM | POA: Diagnosis not present

## 2016-08-15 DIAGNOSIS — I739 Peripheral vascular disease, unspecified: Secondary | ICD-10-CM | POA: Diagnosis not present

## 2016-09-25 DIAGNOSIS — M859 Disorder of bone density and structure, unspecified: Secondary | ICD-10-CM | POA: Diagnosis not present

## 2016-09-25 DIAGNOSIS — D6489 Other specified anemias: Secondary | ICD-10-CM | POA: Diagnosis not present

## 2016-09-25 DIAGNOSIS — E784 Other hyperlipidemia: Secondary | ICD-10-CM | POA: Diagnosis not present

## 2016-09-25 DIAGNOSIS — E038 Other specified hypothyroidism: Secondary | ICD-10-CM | POA: Diagnosis not present

## 2016-09-25 DIAGNOSIS — G309 Alzheimer's disease, unspecified: Secondary | ICD-10-CM | POA: Diagnosis not present

## 2016-09-25 DIAGNOSIS — Z111 Encounter for screening for respiratory tuberculosis: Secondary | ICD-10-CM | POA: Diagnosis not present

## 2016-09-25 DIAGNOSIS — I10 Essential (primary) hypertension: Secondary | ICD-10-CM | POA: Diagnosis not present

## 2016-09-25 DIAGNOSIS — I480 Paroxysmal atrial fibrillation: Secondary | ICD-10-CM | POA: Diagnosis not present

## 2016-09-25 DIAGNOSIS — I83893 Varicose veins of bilateral lower extremities with other complications: Secondary | ICD-10-CM | POA: Diagnosis not present

## 2016-09-25 DIAGNOSIS — H9193 Unspecified hearing loss, bilateral: Secondary | ICD-10-CM | POA: Diagnosis not present

## 2016-09-26 DIAGNOSIS — L602 Onychogryphosis: Secondary | ICD-10-CM | POA: Diagnosis not present

## 2016-09-26 DIAGNOSIS — L84 Corns and callosities: Secondary | ICD-10-CM | POA: Diagnosis not present

## 2016-10-18 DIAGNOSIS — I482 Chronic atrial fibrillation: Secondary | ICD-10-CM | POA: Diagnosis not present

## 2016-10-18 DIAGNOSIS — G4709 Other insomnia: Secondary | ICD-10-CM | POA: Diagnosis not present

## 2016-10-18 DIAGNOSIS — H409 Unspecified glaucoma: Secondary | ICD-10-CM | POA: Diagnosis not present

## 2016-10-18 DIAGNOSIS — I10 Essential (primary) hypertension: Secondary | ICD-10-CM | POA: Diagnosis not present

## 2016-10-18 DIAGNOSIS — F0281 Dementia in other diseases classified elsewhere with behavioral disturbance: Secondary | ICD-10-CM | POA: Diagnosis not present

## 2016-10-18 DIAGNOSIS — E559 Vitamin D deficiency, unspecified: Secondary | ICD-10-CM | POA: Diagnosis not present

## 2016-10-18 DIAGNOSIS — E039 Hypothyroidism, unspecified: Secondary | ICD-10-CM | POA: Diagnosis not present

## 2016-11-08 DIAGNOSIS — I10 Essential (primary) hypertension: Secondary | ICD-10-CM | POA: Diagnosis not present

## 2016-11-08 DIAGNOSIS — H9190 Unspecified hearing loss, unspecified ear: Secondary | ICD-10-CM | POA: Diagnosis not present

## 2016-11-08 DIAGNOSIS — H401230 Low-tension glaucoma, bilateral, stage unspecified: Secondary | ICD-10-CM | POA: Diagnosis not present

## 2016-11-08 DIAGNOSIS — R269 Unspecified abnormalities of gait and mobility: Secondary | ICD-10-CM | POA: Diagnosis not present

## 2016-11-08 DIAGNOSIS — G309 Alzheimer's disease, unspecified: Secondary | ICD-10-CM | POA: Diagnosis not present

## 2016-11-08 DIAGNOSIS — I482 Chronic atrial fibrillation: Secondary | ICD-10-CM | POA: Diagnosis not present

## 2016-11-08 DIAGNOSIS — E785 Hyperlipidemia, unspecified: Secondary | ICD-10-CM | POA: Diagnosis not present

## 2016-11-08 DIAGNOSIS — E039 Hypothyroidism, unspecified: Secondary | ICD-10-CM | POA: Diagnosis not present

## 2016-11-22 DIAGNOSIS — R21 Rash and other nonspecific skin eruption: Secondary | ICD-10-CM | POA: Diagnosis not present

## 2016-11-26 DIAGNOSIS — F331 Major depressive disorder, recurrent, moderate: Secondary | ICD-10-CM | POA: Diagnosis not present

## 2016-11-26 DIAGNOSIS — G301 Alzheimer's disease with late onset: Secondary | ICD-10-CM | POA: Diagnosis not present

## 2016-11-26 DIAGNOSIS — F0391 Unspecified dementia with behavioral disturbance: Secondary | ICD-10-CM | POA: Diagnosis not present

## 2016-11-26 DIAGNOSIS — F5101 Primary insomnia: Secondary | ICD-10-CM | POA: Diagnosis not present

## 2016-12-10 DIAGNOSIS — G301 Alzheimer's disease with late onset: Secondary | ICD-10-CM | POA: Diagnosis not present

## 2016-12-10 DIAGNOSIS — R69 Illness, unspecified: Secondary | ICD-10-CM | POA: Diagnosis not present

## 2016-12-24 DIAGNOSIS — R69 Illness, unspecified: Secondary | ICD-10-CM | POA: Diagnosis not present

## 2016-12-24 DIAGNOSIS — G301 Alzheimer's disease with late onset: Secondary | ICD-10-CM | POA: Diagnosis not present

## 2017-01-07 DIAGNOSIS — G301 Alzheimer's disease with late onset: Secondary | ICD-10-CM | POA: Diagnosis not present

## 2017-01-07 DIAGNOSIS — R69 Illness, unspecified: Secondary | ICD-10-CM | POA: Diagnosis not present

## 2017-01-08 DIAGNOSIS — G4709 Other insomnia: Secondary | ICD-10-CM | POA: Diagnosis not present

## 2017-01-08 DIAGNOSIS — F0281 Dementia in other diseases classified elsewhere with behavioral disturbance: Secondary | ICD-10-CM | POA: Diagnosis not present

## 2017-01-08 DIAGNOSIS — E039 Hypothyroidism, unspecified: Secondary | ICD-10-CM | POA: Diagnosis not present

## 2017-01-08 DIAGNOSIS — H409 Unspecified glaucoma: Secondary | ICD-10-CM | POA: Diagnosis not present

## 2017-01-08 DIAGNOSIS — E559 Vitamin D deficiency, unspecified: Secondary | ICD-10-CM | POA: Diagnosis not present

## 2017-01-08 DIAGNOSIS — I10 Essential (primary) hypertension: Secondary | ICD-10-CM | POA: Diagnosis not present

## 2017-01-08 DIAGNOSIS — I482 Chronic atrial fibrillation: Secondary | ICD-10-CM | POA: Diagnosis not present

## 2017-01-09 DIAGNOSIS — B351 Tinea unguium: Secondary | ICD-10-CM | POA: Diagnosis not present

## 2017-01-09 DIAGNOSIS — Q845 Enlarged and hypertrophic nails: Secondary | ICD-10-CM | POA: Diagnosis not present

## 2017-01-09 DIAGNOSIS — L853 Xerosis cutis: Secondary | ICD-10-CM | POA: Diagnosis not present

## 2017-01-09 DIAGNOSIS — L603 Nail dystrophy: Secondary | ICD-10-CM | POA: Diagnosis not present

## 2017-01-09 DIAGNOSIS — L84 Corns and callosities: Secondary | ICD-10-CM | POA: Diagnosis not present

## 2017-02-05 DIAGNOSIS — R296 Repeated falls: Secondary | ICD-10-CM | POA: Diagnosis not present

## 2017-02-05 DIAGNOSIS — I482 Chronic atrial fibrillation: Secondary | ICD-10-CM | POA: Diagnosis not present

## 2017-02-05 DIAGNOSIS — H409 Unspecified glaucoma: Secondary | ICD-10-CM | POA: Diagnosis not present

## 2017-02-05 DIAGNOSIS — I10 Essential (primary) hypertension: Secondary | ICD-10-CM | POA: Diagnosis not present

## 2017-02-05 DIAGNOSIS — E039 Hypothyroidism, unspecified: Secondary | ICD-10-CM | POA: Diagnosis not present

## 2017-02-05 DIAGNOSIS — E559 Vitamin D deficiency, unspecified: Secondary | ICD-10-CM | POA: Diagnosis not present

## 2017-02-05 DIAGNOSIS — G4709 Other insomnia: Secondary | ICD-10-CM | POA: Diagnosis not present

## 2017-02-11 DIAGNOSIS — R69 Illness, unspecified: Secondary | ICD-10-CM | POA: Diagnosis not present

## 2017-02-11 DIAGNOSIS — G301 Alzheimer's disease with late onset: Secondary | ICD-10-CM | POA: Diagnosis not present

## 2017-03-05 DIAGNOSIS — E039 Hypothyroidism, unspecified: Secondary | ICD-10-CM | POA: Diagnosis not present

## 2017-03-05 DIAGNOSIS — E559 Vitamin D deficiency, unspecified: Secondary | ICD-10-CM | POA: Diagnosis not present

## 2017-03-05 DIAGNOSIS — I10 Essential (primary) hypertension: Secondary | ICD-10-CM | POA: Diagnosis not present

## 2017-03-05 DIAGNOSIS — H409 Unspecified glaucoma: Secondary | ICD-10-CM | POA: Diagnosis not present

## 2017-03-05 DIAGNOSIS — G4709 Other insomnia: Secondary | ICD-10-CM | POA: Diagnosis not present

## 2017-03-05 DIAGNOSIS — R296 Repeated falls: Secondary | ICD-10-CM | POA: Diagnosis not present

## 2017-03-05 DIAGNOSIS — I482 Chronic atrial fibrillation: Secondary | ICD-10-CM | POA: Diagnosis not present

## 2017-03-11 DIAGNOSIS — Z79899 Other long term (current) drug therapy: Secondary | ICD-10-CM | POA: Diagnosis not present

## 2017-03-11 DIAGNOSIS — S81811A Laceration without foreign body, right lower leg, initial encounter: Secondary | ICD-10-CM | POA: Diagnosis not present

## 2017-03-11 DIAGNOSIS — F419 Anxiety disorder, unspecified: Secondary | ICD-10-CM | POA: Diagnosis not present

## 2017-03-11 DIAGNOSIS — W228XXA Striking against or struck by other objects, initial encounter: Secondary | ICD-10-CM | POA: Diagnosis not present

## 2017-03-11 DIAGNOSIS — R69 Illness, unspecified: Secondary | ICD-10-CM | POA: Diagnosis not present

## 2017-03-11 DIAGNOSIS — R404 Transient alteration of awareness: Secondary | ICD-10-CM | POA: Diagnosis not present

## 2017-03-11 DIAGNOSIS — Z66 Do not resuscitate: Secondary | ICD-10-CM | POA: Diagnosis not present

## 2017-03-11 DIAGNOSIS — S81801A Unspecified open wound, right lower leg, initial encounter: Secondary | ICD-10-CM | POA: Diagnosis not present

## 2017-03-11 DIAGNOSIS — R451 Restlessness and agitation: Secondary | ICD-10-CM | POA: Diagnosis not present

## 2017-03-11 DIAGNOSIS — Z7189 Other specified counseling: Secondary | ICD-10-CM | POA: Diagnosis not present

## 2017-03-11 DIAGNOSIS — F0391 Unspecified dementia with behavioral disturbance: Secondary | ICD-10-CM | POA: Diagnosis not present

## 2017-03-12 DIAGNOSIS — R69 Illness, unspecified: Secondary | ICD-10-CM | POA: Diagnosis not present

## 2017-03-12 DIAGNOSIS — I10 Essential (primary) hypertension: Secondary | ICD-10-CM | POA: Diagnosis not present

## 2017-03-12 DIAGNOSIS — S81801A Unspecified open wound, right lower leg, initial encounter: Secondary | ICD-10-CM | POA: Diagnosis not present

## 2017-03-16 DIAGNOSIS — I4891 Unspecified atrial fibrillation: Secondary | ICD-10-CM | POA: Diagnosis not present

## 2017-03-16 DIAGNOSIS — G309 Alzheimer's disease, unspecified: Secondary | ICD-10-CM | POA: Diagnosis not present

## 2017-03-16 DIAGNOSIS — S81801D Unspecified open wound, right lower leg, subsequent encounter: Secondary | ICD-10-CM | POA: Diagnosis not present

## 2017-03-16 DIAGNOSIS — I10 Essential (primary) hypertension: Secondary | ICD-10-CM | POA: Diagnosis not present

## 2017-03-16 DIAGNOSIS — H8111 Benign paroxysmal vertigo, right ear: Secondary | ICD-10-CM | POA: Diagnosis not present

## 2017-03-16 DIAGNOSIS — H409 Unspecified glaucoma: Secondary | ICD-10-CM | POA: Diagnosis not present

## 2017-03-18 DIAGNOSIS — H409 Unspecified glaucoma: Secondary | ICD-10-CM | POA: Diagnosis not present

## 2017-03-18 DIAGNOSIS — R69 Illness, unspecified: Secondary | ICD-10-CM | POA: Diagnosis not present

## 2017-03-18 DIAGNOSIS — G309 Alzheimer's disease, unspecified: Secondary | ICD-10-CM | POA: Diagnosis not present

## 2017-03-18 DIAGNOSIS — I10 Essential (primary) hypertension: Secondary | ICD-10-CM | POA: Diagnosis not present

## 2017-03-18 DIAGNOSIS — I4891 Unspecified atrial fibrillation: Secondary | ICD-10-CM | POA: Diagnosis not present

## 2017-03-18 DIAGNOSIS — G301 Alzheimer's disease with late onset: Secondary | ICD-10-CM | POA: Diagnosis not present

## 2017-03-18 DIAGNOSIS — S81801D Unspecified open wound, right lower leg, subsequent encounter: Secondary | ICD-10-CM | POA: Diagnosis not present

## 2017-03-20 DIAGNOSIS — H409 Unspecified glaucoma: Secondary | ICD-10-CM | POA: Diagnosis not present

## 2017-03-20 DIAGNOSIS — H401133 Primary open-angle glaucoma, bilateral, severe stage: Secondary | ICD-10-CM | POA: Diagnosis not present

## 2017-03-20 DIAGNOSIS — G309 Alzheimer's disease, unspecified: Secondary | ICD-10-CM | POA: Diagnosis not present

## 2017-03-20 DIAGNOSIS — I10 Essential (primary) hypertension: Secondary | ICD-10-CM | POA: Diagnosis not present

## 2017-03-20 DIAGNOSIS — S81801D Unspecified open wound, right lower leg, subsequent encounter: Secondary | ICD-10-CM | POA: Diagnosis not present

## 2017-03-20 DIAGNOSIS — H524 Presbyopia: Secondary | ICD-10-CM | POA: Diagnosis not present

## 2017-03-20 DIAGNOSIS — I4891 Unspecified atrial fibrillation: Secondary | ICD-10-CM | POA: Diagnosis not present

## 2017-03-20 DIAGNOSIS — H04123 Dry eye syndrome of bilateral lacrimal glands: Secondary | ICD-10-CM | POA: Diagnosis not present

## 2017-03-22 DIAGNOSIS — G309 Alzheimer's disease, unspecified: Secondary | ICD-10-CM | POA: Diagnosis not present

## 2017-03-22 DIAGNOSIS — H409 Unspecified glaucoma: Secondary | ICD-10-CM | POA: Diagnosis not present

## 2017-03-22 DIAGNOSIS — I4891 Unspecified atrial fibrillation: Secondary | ICD-10-CM | POA: Diagnosis not present

## 2017-03-22 DIAGNOSIS — I10 Essential (primary) hypertension: Secondary | ICD-10-CM | POA: Diagnosis not present

## 2017-03-22 DIAGNOSIS — S81801D Unspecified open wound, right lower leg, subsequent encounter: Secondary | ICD-10-CM | POA: Diagnosis not present

## 2017-03-25 DIAGNOSIS — I10 Essential (primary) hypertension: Secondary | ICD-10-CM | POA: Diagnosis not present

## 2017-03-25 DIAGNOSIS — I4891 Unspecified atrial fibrillation: Secondary | ICD-10-CM | POA: Diagnosis not present

## 2017-03-25 DIAGNOSIS — H409 Unspecified glaucoma: Secondary | ICD-10-CM | POA: Diagnosis not present

## 2017-03-25 DIAGNOSIS — G309 Alzheimer's disease, unspecified: Secondary | ICD-10-CM | POA: Diagnosis not present

## 2017-03-25 DIAGNOSIS — S81801D Unspecified open wound, right lower leg, subsequent encounter: Secondary | ICD-10-CM | POA: Diagnosis not present

## 2017-03-26 DIAGNOSIS — E039 Hypothyroidism, unspecified: Secondary | ICD-10-CM | POA: Diagnosis not present

## 2017-03-26 DIAGNOSIS — R404 Transient alteration of awareness: Secondary | ICD-10-CM | POA: Diagnosis not present

## 2017-03-26 DIAGNOSIS — S81801A Unspecified open wound, right lower leg, initial encounter: Secondary | ICD-10-CM | POA: Diagnosis not present

## 2017-03-26 DIAGNOSIS — S81802A Unspecified open wound, left lower leg, initial encounter: Secondary | ICD-10-CM | POA: Diagnosis not present

## 2017-03-26 DIAGNOSIS — Z7901 Long term (current) use of anticoagulants: Secondary | ICD-10-CM | POA: Diagnosis not present

## 2017-03-26 DIAGNOSIS — L03115 Cellulitis of right lower limb: Secondary | ICD-10-CM | POA: Diagnosis not present

## 2017-03-26 DIAGNOSIS — Z8673 Personal history of transient ischemic attack (TIA), and cerebral infarction without residual deficits: Secondary | ICD-10-CM | POA: Diagnosis not present

## 2017-03-26 DIAGNOSIS — M79605 Pain in left leg: Secondary | ICD-10-CM | POA: Diagnosis not present

## 2017-03-26 DIAGNOSIS — R69 Illness, unspecified: Secondary | ICD-10-CM | POA: Diagnosis not present

## 2017-03-26 DIAGNOSIS — M25572 Pain in left ankle and joints of left foot: Secondary | ICD-10-CM | POA: Diagnosis not present

## 2017-03-26 DIAGNOSIS — I10 Essential (primary) hypertension: Secondary | ICD-10-CM | POA: Diagnosis not present

## 2017-03-26 DIAGNOSIS — R531 Weakness: Secondary | ICD-10-CM | POA: Diagnosis not present

## 2017-03-26 DIAGNOSIS — G4700 Insomnia, unspecified: Secondary | ICD-10-CM | POA: Diagnosis not present

## 2017-03-26 DIAGNOSIS — E119 Type 2 diabetes mellitus without complications: Secondary | ICD-10-CM | POA: Diagnosis not present

## 2017-03-26 DIAGNOSIS — I4891 Unspecified atrial fibrillation: Secondary | ICD-10-CM | POA: Diagnosis not present

## 2017-03-26 DIAGNOSIS — L03116 Cellulitis of left lower limb: Secondary | ICD-10-CM | POA: Diagnosis not present

## 2017-03-26 DIAGNOSIS — M79662 Pain in left lower leg: Secondary | ICD-10-CM | POA: Diagnosis not present

## 2017-03-27 DIAGNOSIS — L03116 Cellulitis of left lower limb: Secondary | ICD-10-CM | POA: Diagnosis not present

## 2017-03-27 DIAGNOSIS — S81802A Unspecified open wound, left lower leg, initial encounter: Secondary | ICD-10-CM | POA: Diagnosis not present

## 2017-03-27 DIAGNOSIS — L03115 Cellulitis of right lower limb: Secondary | ICD-10-CM | POA: Diagnosis not present

## 2017-03-27 DIAGNOSIS — R69 Illness, unspecified: Secondary | ICD-10-CM | POA: Diagnosis not present

## 2017-03-27 DIAGNOSIS — Z7901 Long term (current) use of anticoagulants: Secondary | ICD-10-CM | POA: Diagnosis not present

## 2017-03-27 DIAGNOSIS — I10 Essential (primary) hypertension: Secondary | ICD-10-CM | POA: Diagnosis not present

## 2017-03-27 DIAGNOSIS — I4891 Unspecified atrial fibrillation: Secondary | ICD-10-CM | POA: Diagnosis not present

## 2017-03-27 DIAGNOSIS — S81801A Unspecified open wound, right lower leg, initial encounter: Secondary | ICD-10-CM | POA: Diagnosis not present

## 2017-03-28 DIAGNOSIS — R69 Illness, unspecified: Secondary | ICD-10-CM | POA: Diagnosis not present

## 2017-03-28 DIAGNOSIS — I4891 Unspecified atrial fibrillation: Secondary | ICD-10-CM | POA: Diagnosis not present

## 2017-03-28 DIAGNOSIS — I10 Essential (primary) hypertension: Secondary | ICD-10-CM | POA: Diagnosis not present

## 2017-03-28 DIAGNOSIS — Z7901 Long term (current) use of anticoagulants: Secondary | ICD-10-CM | POA: Diagnosis not present

## 2017-03-28 DIAGNOSIS — L03116 Cellulitis of left lower limb: Secondary | ICD-10-CM | POA: Diagnosis not present

## 2017-03-29 DIAGNOSIS — I10 Essential (primary) hypertension: Secondary | ICD-10-CM | POA: Diagnosis not present

## 2017-03-29 DIAGNOSIS — Z8673 Personal history of transient ischemic attack (TIA), and cerebral infarction without residual deficits: Secondary | ICD-10-CM | POA: Diagnosis not present

## 2017-03-29 DIAGNOSIS — Z66 Do not resuscitate: Secondary | ICD-10-CM | POA: Diagnosis not present

## 2017-03-29 DIAGNOSIS — I4891 Unspecified atrial fibrillation: Secondary | ICD-10-CM | POA: Diagnosis not present

## 2017-03-29 DIAGNOSIS — R58 Hemorrhage, not elsewhere classified: Secondary | ICD-10-CM | POA: Diagnosis not present

## 2017-03-29 DIAGNOSIS — W19XXXA Unspecified fall, initial encounter: Secondary | ICD-10-CM | POA: Diagnosis not present

## 2017-03-29 DIAGNOSIS — R109 Unspecified abdominal pain: Secondary | ICD-10-CM | POA: Diagnosis not present

## 2017-03-29 DIAGNOSIS — S300XXA Contusion of lower back and pelvis, initial encounter: Secondary | ICD-10-CM | POA: Diagnosis not present

## 2017-03-29 DIAGNOSIS — Z7901 Long term (current) use of anticoagulants: Secondary | ICD-10-CM | POA: Diagnosis not present

## 2017-03-29 DIAGNOSIS — M5489 Other dorsalgia: Secondary | ICD-10-CM | POA: Diagnosis not present

## 2017-03-29 DIAGNOSIS — S0990XA Unspecified injury of head, initial encounter: Secondary | ICD-10-CM | POA: Diagnosis not present

## 2017-03-29 DIAGNOSIS — R69 Illness, unspecified: Secondary | ICD-10-CM | POA: Diagnosis not present

## 2017-03-29 DIAGNOSIS — I878 Other specified disorders of veins: Secondary | ICD-10-CM | POA: Diagnosis not present

## 2017-03-29 DIAGNOSIS — S098XXA Other specified injuries of head, initial encounter: Secondary | ICD-10-CM | POA: Diagnosis not present

## 2017-03-29 DIAGNOSIS — T1490XA Injury, unspecified, initial encounter: Secondary | ICD-10-CM | POA: Diagnosis not present

## 2017-03-29 DIAGNOSIS — W1830XA Fall on same level, unspecified, initial encounter: Secondary | ICD-10-CM | POA: Diagnosis not present

## 2017-03-29 DIAGNOSIS — M25551 Pain in right hip: Secondary | ICD-10-CM | POA: Diagnosis not present

## 2017-03-29 DIAGNOSIS — G4489 Other headache syndrome: Secondary | ICD-10-CM | POA: Diagnosis not present

## 2017-03-30 DIAGNOSIS — W19XXXA Unspecified fall, initial encounter: Secondary | ICD-10-CM | POA: Diagnosis not present

## 2017-03-30 DIAGNOSIS — S300XXA Contusion of lower back and pelvis, initial encounter: Secondary | ICD-10-CM | POA: Diagnosis not present

## 2017-03-31 DIAGNOSIS — S300XXA Contusion of lower back and pelvis, initial encounter: Secondary | ICD-10-CM | POA: Diagnosis not present

## 2017-03-31 DIAGNOSIS — W19XXXA Unspecified fall, initial encounter: Secondary | ICD-10-CM | POA: Diagnosis not present

## 2017-04-01 DIAGNOSIS — W19XXXA Unspecified fall, initial encounter: Secondary | ICD-10-CM | POA: Diagnosis not present

## 2017-04-01 DIAGNOSIS — S300XXA Contusion of lower back and pelvis, initial encounter: Secondary | ICD-10-CM | POA: Diagnosis not present

## 2017-04-02 DIAGNOSIS — I371 Nonrheumatic pulmonary valve insufficiency: Secondary | ICD-10-CM | POA: Diagnosis not present

## 2017-04-02 DIAGNOSIS — T68XXXA Hypothermia, initial encounter: Secondary | ICD-10-CM | POA: Diagnosis not present

## 2017-04-02 DIAGNOSIS — L0390 Cellulitis, unspecified: Secondary | ICD-10-CM | POA: Diagnosis not present

## 2017-04-02 DIAGNOSIS — I77810 Thoracic aortic ectasia: Secondary | ICD-10-CM | POA: Diagnosis not present

## 2017-04-02 DIAGNOSIS — I4891 Unspecified atrial fibrillation: Secondary | ICD-10-CM | POA: Diagnosis not present

## 2017-04-02 DIAGNOSIS — S81809A Unspecified open wound, unspecified lower leg, initial encounter: Secondary | ICD-10-CM | POA: Diagnosis not present

## 2017-04-02 DIAGNOSIS — I482 Chronic atrial fibrillation: Secondary | ICD-10-CM | POA: Diagnosis not present

## 2017-04-02 DIAGNOSIS — R531 Weakness: Secondary | ICD-10-CM | POA: Diagnosis not present

## 2017-04-02 DIAGNOSIS — I517 Cardiomegaly: Secondary | ICD-10-CM | POA: Diagnosis not present

## 2017-04-02 DIAGNOSIS — A419 Sepsis, unspecified organism: Secondary | ICD-10-CM | POA: Diagnosis not present

## 2017-04-02 DIAGNOSIS — Z7189 Other specified counseling: Secondary | ICD-10-CM | POA: Diagnosis not present

## 2017-04-02 DIAGNOSIS — D500 Iron deficiency anemia secondary to blood loss (chronic): Secondary | ICD-10-CM | POA: Diagnosis not present

## 2017-04-02 DIAGNOSIS — R001 Bradycardia, unspecified: Secondary | ICD-10-CM | POA: Diagnosis not present

## 2017-04-02 DIAGNOSIS — L089 Local infection of the skin and subcutaneous tissue, unspecified: Secondary | ICD-10-CM | POA: Diagnosis not present

## 2017-04-02 DIAGNOSIS — I088 Other rheumatic multiple valve diseases: Secondary | ICD-10-CM | POA: Diagnosis not present

## 2017-04-02 DIAGNOSIS — G309 Alzheimer's disease, unspecified: Secondary | ICD-10-CM | POA: Diagnosis not present

## 2017-04-02 DIAGNOSIS — D649 Anemia, unspecified: Secondary | ICD-10-CM | POA: Diagnosis not present

## 2017-04-02 DIAGNOSIS — W1830XA Fall on same level, unspecified, initial encounter: Secondary | ICD-10-CM | POA: Diagnosis not present

## 2017-04-02 DIAGNOSIS — I083 Combined rheumatic disorders of mitral, aortic and tricuspid valves: Secondary | ICD-10-CM | POA: Diagnosis not present

## 2017-04-02 DIAGNOSIS — A498 Other bacterial infections of unspecified site: Secondary | ICD-10-CM | POA: Diagnosis not present

## 2017-04-03 DIAGNOSIS — I088 Other rheumatic multiple valve diseases: Secondary | ICD-10-CM | POA: Diagnosis not present

## 2017-04-03 DIAGNOSIS — G309 Alzheimer's disease, unspecified: Secondary | ICD-10-CM | POA: Diagnosis not present

## 2017-04-03 DIAGNOSIS — I371 Nonrheumatic pulmonary valve insufficiency: Secondary | ICD-10-CM | POA: Diagnosis not present

## 2017-04-03 DIAGNOSIS — S81809A Unspecified open wound, unspecified lower leg, initial encounter: Secondary | ICD-10-CM | POA: Diagnosis not present

## 2017-04-03 DIAGNOSIS — I517 Cardiomegaly: Secondary | ICD-10-CM | POA: Diagnosis not present

## 2017-04-03 DIAGNOSIS — D500 Iron deficiency anemia secondary to blood loss (chronic): Secondary | ICD-10-CM | POA: Diagnosis not present

## 2017-04-03 DIAGNOSIS — I638 Other cerebral infarction: Secondary | ICD-10-CM | POA: Diagnosis not present

## 2017-04-03 DIAGNOSIS — L97211 Non-pressure chronic ulcer of right calf limited to breakdown of skin: Secondary | ICD-10-CM | POA: Diagnosis not present

## 2017-04-03 DIAGNOSIS — R0602 Shortness of breath: Secondary | ICD-10-CM | POA: Diagnosis not present

## 2017-04-03 DIAGNOSIS — E039 Hypothyroidism, unspecified: Secondary | ICD-10-CM | POA: Diagnosis not present

## 2017-04-03 DIAGNOSIS — I878 Other specified disorders of veins: Secondary | ICD-10-CM | POA: Diagnosis not present

## 2017-04-03 DIAGNOSIS — R69 Illness, unspecified: Secondary | ICD-10-CM | POA: Diagnosis not present

## 2017-04-03 DIAGNOSIS — I4891 Unspecified atrial fibrillation: Secondary | ICD-10-CM | POA: Diagnosis not present

## 2017-04-03 DIAGNOSIS — A498 Other bacterial infections of unspecified site: Secondary | ICD-10-CM | POA: Diagnosis not present

## 2017-04-03 DIAGNOSIS — D649 Anemia, unspecified: Secondary | ICD-10-CM | POA: Diagnosis not present

## 2017-04-03 DIAGNOSIS — I482 Chronic atrial fibrillation: Secondary | ICD-10-CM | POA: Diagnosis not present

## 2017-04-03 DIAGNOSIS — R2689 Other abnormalities of gait and mobility: Secondary | ICD-10-CM | POA: Diagnosis not present

## 2017-04-03 DIAGNOSIS — L84 Corns and callosities: Secondary | ICD-10-CM | POA: Diagnosis not present

## 2017-04-03 DIAGNOSIS — L97921 Non-pressure chronic ulcer of unspecified part of left lower leg limited to breakdown of skin: Secondary | ICD-10-CM | POA: Diagnosis not present

## 2017-04-03 DIAGNOSIS — R638 Other symptoms and signs concerning food and fluid intake: Secondary | ICD-10-CM | POA: Diagnosis not present

## 2017-04-03 DIAGNOSIS — R680 Hypothermia, not associated with low environmental temperature: Secondary | ICD-10-CM | POA: Diagnosis not present

## 2017-04-03 DIAGNOSIS — S8011XA Contusion of right lower leg, initial encounter: Secondary | ICD-10-CM | POA: Diagnosis not present

## 2017-04-03 DIAGNOSIS — D62 Acute posthemorrhagic anemia: Secondary | ICD-10-CM | POA: Diagnosis not present

## 2017-04-03 DIAGNOSIS — R41841 Cognitive communication deficit: Secondary | ICD-10-CM | POA: Diagnosis not present

## 2017-04-03 DIAGNOSIS — I77810 Thoracic aortic ectasia: Secondary | ICD-10-CM | POA: Diagnosis not present

## 2017-04-03 DIAGNOSIS — I10 Essential (primary) hypertension: Secondary | ICD-10-CM | POA: Diagnosis not present

## 2017-04-03 DIAGNOSIS — L089 Local infection of the skin and subcutaneous tissue, unspecified: Secondary | ICD-10-CM | POA: Diagnosis not present

## 2017-04-03 DIAGNOSIS — L97911 Non-pressure chronic ulcer of unspecified part of right lower leg limited to breakdown of skin: Secondary | ICD-10-CM | POA: Diagnosis not present

## 2017-04-03 DIAGNOSIS — I083 Combined rheumatic disorders of mitral, aortic and tricuspid valves: Secondary | ICD-10-CM | POA: Diagnosis not present

## 2017-04-03 DIAGNOSIS — Z7189 Other specified counseling: Secondary | ICD-10-CM | POA: Diagnosis not present

## 2017-04-03 DIAGNOSIS — I272 Pulmonary hypertension, unspecified: Secondary | ICD-10-CM | POA: Diagnosis not present

## 2017-04-03 DIAGNOSIS — I7389 Other specified peripheral vascular diseases: Secondary | ICD-10-CM | POA: Diagnosis not present

## 2017-04-03 DIAGNOSIS — A419 Sepsis, unspecified organism: Secondary | ICD-10-CM | POA: Diagnosis not present

## 2017-04-03 DIAGNOSIS — L03115 Cellulitis of right lower limb: Secondary | ICD-10-CM | POA: Diagnosis not present

## 2017-04-03 DIAGNOSIS — R1311 Dysphagia, oral phase: Secondary | ICD-10-CM | POA: Diagnosis not present

## 2017-04-03 DIAGNOSIS — R001 Bradycardia, unspecified: Secondary | ICD-10-CM | POA: Diagnosis not present

## 2017-04-03 DIAGNOSIS — L03818 Cellulitis of other sites: Secondary | ICD-10-CM | POA: Diagnosis not present

## 2017-04-03 DIAGNOSIS — M6281 Muscle weakness (generalized): Secondary | ICD-10-CM | POA: Diagnosis not present

## 2017-04-03 DIAGNOSIS — I498 Other specified cardiac arrhythmias: Secondary | ICD-10-CM | POA: Diagnosis not present

## 2017-04-03 DIAGNOSIS — R531 Weakness: Secondary | ICD-10-CM | POA: Diagnosis not present

## 2017-04-03 DIAGNOSIS — T68XXXA Hypothermia, initial encounter: Secondary | ICD-10-CM | POA: Diagnosis not present

## 2017-04-08 DIAGNOSIS — I878 Other specified disorders of veins: Secondary | ICD-10-CM | POA: Diagnosis not present

## 2017-04-08 DIAGNOSIS — R69 Illness, unspecified: Secondary | ICD-10-CM | POA: Diagnosis not present

## 2017-04-08 DIAGNOSIS — M6281 Muscle weakness (generalized): Secondary | ICD-10-CM | POA: Diagnosis not present

## 2017-04-08 DIAGNOSIS — I7389 Other specified peripheral vascular diseases: Secondary | ICD-10-CM | POA: Diagnosis not present

## 2017-04-08 DIAGNOSIS — I482 Chronic atrial fibrillation: Secondary | ICD-10-CM | POA: Diagnosis not present

## 2017-04-08 DIAGNOSIS — L03818 Cellulitis of other sites: Secondary | ICD-10-CM | POA: Diagnosis not present

## 2017-04-08 DIAGNOSIS — I498 Other specified cardiac arrhythmias: Secondary | ICD-10-CM | POA: Diagnosis not present

## 2017-04-08 DIAGNOSIS — I638 Other cerebral infarction: Secondary | ICD-10-CM | POA: Diagnosis not present

## 2017-04-08 DIAGNOSIS — R600 Localized edema: Secondary | ICD-10-CM | POA: Diagnosis not present

## 2017-04-08 DIAGNOSIS — R296 Repeated falls: Secondary | ICD-10-CM | POA: Diagnosis not present

## 2017-04-08 DIAGNOSIS — G309 Alzheimer's disease, unspecified: Secondary | ICD-10-CM | POA: Diagnosis not present

## 2017-04-08 DIAGNOSIS — R531 Weakness: Secondary | ICD-10-CM | POA: Diagnosis not present

## 2017-04-08 DIAGNOSIS — R41841 Cognitive communication deficit: Secondary | ICD-10-CM | POA: Diagnosis not present

## 2017-04-08 DIAGNOSIS — R1311 Dysphagia, oral phase: Secondary | ICD-10-CM | POA: Diagnosis not present

## 2017-04-08 DIAGNOSIS — E039 Hypothyroidism, unspecified: Secondary | ICD-10-CM | POA: Diagnosis not present

## 2017-04-08 DIAGNOSIS — R001 Bradycardia, unspecified: Secondary | ICD-10-CM | POA: Diagnosis not present

## 2017-04-08 DIAGNOSIS — S300XXA Contusion of lower back and pelvis, initial encounter: Secondary | ICD-10-CM | POA: Diagnosis not present

## 2017-04-08 DIAGNOSIS — L03115 Cellulitis of right lower limb: Secondary | ICD-10-CM | POA: Diagnosis not present

## 2017-04-08 DIAGNOSIS — R2689 Other abnormalities of gait and mobility: Secondary | ICD-10-CM | POA: Diagnosis not present

## 2017-04-08 DIAGNOSIS — S81809A Unspecified open wound, unspecified lower leg, initial encounter: Secondary | ICD-10-CM | POA: Diagnosis not present

## 2017-04-09 DIAGNOSIS — M6281 Muscle weakness (generalized): Secondary | ICD-10-CM | POA: Diagnosis not present

## 2017-04-09 DIAGNOSIS — R69 Illness, unspecified: Secondary | ICD-10-CM | POA: Diagnosis not present

## 2017-04-09 DIAGNOSIS — G309 Alzheimer's disease, unspecified: Secondary | ICD-10-CM | POA: Diagnosis not present

## 2017-04-09 DIAGNOSIS — E039 Hypothyroidism, unspecified: Secondary | ICD-10-CM | POA: Diagnosis not present

## 2017-04-09 DIAGNOSIS — R001 Bradycardia, unspecified: Secondary | ICD-10-CM | POA: Diagnosis not present

## 2017-04-09 DIAGNOSIS — I482 Chronic atrial fibrillation: Secondary | ICD-10-CM | POA: Diagnosis not present

## 2017-04-09 DIAGNOSIS — R296 Repeated falls: Secondary | ICD-10-CM | POA: Diagnosis not present

## 2017-04-09 DIAGNOSIS — I878 Other specified disorders of veins: Secondary | ICD-10-CM | POA: Diagnosis not present

## 2017-04-10 DIAGNOSIS — I878 Other specified disorders of veins: Secondary | ICD-10-CM | POA: Diagnosis not present

## 2017-04-10 DIAGNOSIS — R69 Illness, unspecified: Secondary | ICD-10-CM | POA: Diagnosis not present

## 2017-04-10 DIAGNOSIS — I482 Chronic atrial fibrillation: Secondary | ICD-10-CM | POA: Diagnosis not present

## 2017-04-10 DIAGNOSIS — G309 Alzheimer's disease, unspecified: Secondary | ICD-10-CM | POA: Diagnosis not present

## 2017-04-19 DIAGNOSIS — I878 Other specified disorders of veins: Secondary | ICD-10-CM | POA: Diagnosis not present

## 2017-04-19 DIAGNOSIS — R69 Illness, unspecified: Secondary | ICD-10-CM | POA: Diagnosis not present

## 2017-04-19 DIAGNOSIS — R600 Localized edema: Secondary | ICD-10-CM | POA: Diagnosis not present

## 2017-04-19 DIAGNOSIS — G309 Alzheimer's disease, unspecified: Secondary | ICD-10-CM | POA: Diagnosis not present

## 2017-04-22 DIAGNOSIS — S300XXA Contusion of lower back and pelvis, initial encounter: Secondary | ICD-10-CM | POA: Diagnosis not present

## 2017-04-22 DIAGNOSIS — S81809A Unspecified open wound, unspecified lower leg, initial encounter: Secondary | ICD-10-CM | POA: Diagnosis not present

## 2017-04-23 DIAGNOSIS — R69 Illness, unspecified: Secondary | ICD-10-CM | POA: Diagnosis not present

## 2017-04-23 DIAGNOSIS — M6281 Muscle weakness (generalized): Secondary | ICD-10-CM | POA: Diagnosis not present

## 2017-04-23 DIAGNOSIS — L03115 Cellulitis of right lower limb: Secondary | ICD-10-CM | POA: Diagnosis not present

## 2017-04-23 DIAGNOSIS — I878 Other specified disorders of veins: Secondary | ICD-10-CM | POA: Diagnosis not present

## 2017-04-23 DIAGNOSIS — G309 Alzheimer's disease, unspecified: Secondary | ICD-10-CM | POA: Diagnosis not present

## 2017-04-26 DIAGNOSIS — R51 Headache: Secondary | ICD-10-CM | POA: Diagnosis not present

## 2017-04-26 DIAGNOSIS — S064X1A Epidural hemorrhage with loss of consciousness of 30 minutes or less, initial encounter: Secondary | ICD-10-CM | POA: Diagnosis not present

## 2017-04-26 DIAGNOSIS — W1830XA Fall on same level, unspecified, initial encounter: Secondary | ICD-10-CM | POA: Diagnosis not present

## 2017-04-26 DIAGNOSIS — I10 Essential (primary) hypertension: Secondary | ICD-10-CM | POA: Diagnosis not present

## 2017-04-26 DIAGNOSIS — R001 Bradycardia, unspecified: Secondary | ICD-10-CM | POA: Diagnosis not present

## 2017-04-26 DIAGNOSIS — S0003XA Contusion of scalp, initial encounter: Secondary | ICD-10-CM | POA: Diagnosis not present

## 2017-04-26 DIAGNOSIS — S098XXA Other specified injuries of head, initial encounter: Secondary | ICD-10-CM | POA: Diagnosis not present

## 2017-04-26 DIAGNOSIS — R69 Illness, unspecified: Secondary | ICD-10-CM | POA: Diagnosis not present

## 2017-04-26 DIAGNOSIS — G309 Alzheimer's disease, unspecified: Secondary | ICD-10-CM | POA: Diagnosis not present

## 2017-04-26 DIAGNOSIS — I4891 Unspecified atrial fibrillation: Secondary | ICD-10-CM | POA: Diagnosis not present

## 2017-04-26 DIAGNOSIS — G301 Alzheimer's disease with late onset: Secondary | ICD-10-CM | POA: Diagnosis not present

## 2017-04-27 DIAGNOSIS — I10 Essential (primary) hypertension: Secondary | ICD-10-CM | POA: Diagnosis not present

## 2017-04-27 DIAGNOSIS — H409 Unspecified glaucoma: Secondary | ICD-10-CM | POA: Diagnosis not present

## 2017-04-27 DIAGNOSIS — G309 Alzheimer's disease, unspecified: Secondary | ICD-10-CM | POA: Diagnosis not present

## 2017-04-27 DIAGNOSIS — I4891 Unspecified atrial fibrillation: Secondary | ICD-10-CM | POA: Diagnosis not present

## 2017-04-27 DIAGNOSIS — S81801D Unspecified open wound, right lower leg, subsequent encounter: Secondary | ICD-10-CM | POA: Diagnosis not present

## 2017-04-29 DIAGNOSIS — I10 Essential (primary) hypertension: Secondary | ICD-10-CM | POA: Diagnosis not present

## 2017-04-29 DIAGNOSIS — G309 Alzheimer's disease, unspecified: Secondary | ICD-10-CM | POA: Diagnosis not present

## 2017-04-29 DIAGNOSIS — S81801D Unspecified open wound, right lower leg, subsequent encounter: Secondary | ICD-10-CM | POA: Diagnosis not present

## 2017-04-29 DIAGNOSIS — H409 Unspecified glaucoma: Secondary | ICD-10-CM | POA: Diagnosis not present

## 2017-04-29 DIAGNOSIS — I4891 Unspecified atrial fibrillation: Secondary | ICD-10-CM | POA: Diagnosis not present

## 2017-04-30 DIAGNOSIS — G309 Alzheimer's disease, unspecified: Secondary | ICD-10-CM | POA: Diagnosis not present

## 2017-04-30 DIAGNOSIS — S81801D Unspecified open wound, right lower leg, subsequent encounter: Secondary | ICD-10-CM | POA: Diagnosis not present

## 2017-04-30 DIAGNOSIS — E559 Vitamin D deficiency, unspecified: Secondary | ICD-10-CM | POA: Diagnosis not present

## 2017-04-30 DIAGNOSIS — R296 Repeated falls: Secondary | ICD-10-CM | POA: Diagnosis not present

## 2017-04-30 DIAGNOSIS — Z008 Encounter for other general examination: Secondary | ICD-10-CM | POA: Diagnosis not present

## 2017-04-30 DIAGNOSIS — G4709 Other insomnia: Secondary | ICD-10-CM | POA: Diagnosis not present

## 2017-04-30 DIAGNOSIS — I482 Chronic atrial fibrillation: Secondary | ICD-10-CM | POA: Diagnosis not present

## 2017-04-30 DIAGNOSIS — E039 Hypothyroidism, unspecified: Secondary | ICD-10-CM | POA: Diagnosis not present

## 2017-04-30 DIAGNOSIS — H409 Unspecified glaucoma: Secondary | ICD-10-CM | POA: Diagnosis not present

## 2017-04-30 DIAGNOSIS — I4891 Unspecified atrial fibrillation: Secondary | ICD-10-CM | POA: Diagnosis not present

## 2017-04-30 DIAGNOSIS — I10 Essential (primary) hypertension: Secondary | ICD-10-CM | POA: Diagnosis not present

## 2017-05-02 DIAGNOSIS — I10 Essential (primary) hypertension: Secondary | ICD-10-CM | POA: Diagnosis not present

## 2017-05-02 DIAGNOSIS — S81801D Unspecified open wound, right lower leg, subsequent encounter: Secondary | ICD-10-CM | POA: Diagnosis not present

## 2017-05-02 DIAGNOSIS — I4891 Unspecified atrial fibrillation: Secondary | ICD-10-CM | POA: Diagnosis not present

## 2017-05-02 DIAGNOSIS — H409 Unspecified glaucoma: Secondary | ICD-10-CM | POA: Diagnosis not present

## 2017-05-02 DIAGNOSIS — G309 Alzheimer's disease, unspecified: Secondary | ICD-10-CM | POA: Diagnosis not present

## 2017-05-07 DIAGNOSIS — I10 Essential (primary) hypertension: Secondary | ICD-10-CM | POA: Diagnosis not present

## 2017-05-07 DIAGNOSIS — H409 Unspecified glaucoma: Secondary | ICD-10-CM | POA: Diagnosis not present

## 2017-05-07 DIAGNOSIS — S81801D Unspecified open wound, right lower leg, subsequent encounter: Secondary | ICD-10-CM | POA: Diagnosis not present

## 2017-05-07 DIAGNOSIS — I4891 Unspecified atrial fibrillation: Secondary | ICD-10-CM | POA: Diagnosis not present

## 2017-05-07 DIAGNOSIS — G309 Alzheimer's disease, unspecified: Secondary | ICD-10-CM | POA: Diagnosis not present

## 2017-05-08 DIAGNOSIS — I482 Chronic atrial fibrillation: Secondary | ICD-10-CM | POA: Diagnosis not present

## 2017-05-08 DIAGNOSIS — I10 Essential (primary) hypertension: Secondary | ICD-10-CM | POA: Diagnosis not present

## 2017-05-08 DIAGNOSIS — H409 Unspecified glaucoma: Secondary | ICD-10-CM | POA: Diagnosis not present

## 2017-05-08 DIAGNOSIS — I4891 Unspecified atrial fibrillation: Secondary | ICD-10-CM | POA: Diagnosis not present

## 2017-05-08 DIAGNOSIS — S81801D Unspecified open wound, right lower leg, subsequent encounter: Secondary | ICD-10-CM | POA: Diagnosis not present

## 2017-05-08 DIAGNOSIS — G309 Alzheimer's disease, unspecified: Secondary | ICD-10-CM | POA: Diagnosis not present

## 2017-05-09 DIAGNOSIS — H409 Unspecified glaucoma: Secondary | ICD-10-CM | POA: Diagnosis not present

## 2017-05-09 DIAGNOSIS — I10 Essential (primary) hypertension: Secondary | ICD-10-CM | POA: Diagnosis not present

## 2017-05-09 DIAGNOSIS — I4891 Unspecified atrial fibrillation: Secondary | ICD-10-CM | POA: Diagnosis not present

## 2017-05-09 DIAGNOSIS — G309 Alzheimer's disease, unspecified: Secondary | ICD-10-CM | POA: Diagnosis not present

## 2017-05-09 DIAGNOSIS — S81801D Unspecified open wound, right lower leg, subsequent encounter: Secondary | ICD-10-CM | POA: Diagnosis not present

## 2017-05-10 DIAGNOSIS — L84 Corns and callosities: Secondary | ICD-10-CM | POA: Diagnosis not present

## 2017-05-10 DIAGNOSIS — L603 Nail dystrophy: Secondary | ICD-10-CM | POA: Diagnosis not present

## 2017-05-10 DIAGNOSIS — Q845 Enlarged and hypertrophic nails: Secondary | ICD-10-CM | POA: Diagnosis not present

## 2017-05-10 DIAGNOSIS — B351 Tinea unguium: Secondary | ICD-10-CM | POA: Diagnosis not present

## 2017-05-10 DIAGNOSIS — L853 Xerosis cutis: Secondary | ICD-10-CM | POA: Diagnosis not present

## 2017-05-13 DIAGNOSIS — H409 Unspecified glaucoma: Secondary | ICD-10-CM | POA: Diagnosis not present

## 2017-05-13 DIAGNOSIS — G309 Alzheimer's disease, unspecified: Secondary | ICD-10-CM | POA: Diagnosis not present

## 2017-05-13 DIAGNOSIS — I10 Essential (primary) hypertension: Secondary | ICD-10-CM | POA: Diagnosis not present

## 2017-05-13 DIAGNOSIS — S81801D Unspecified open wound, right lower leg, subsequent encounter: Secondary | ICD-10-CM | POA: Diagnosis not present

## 2017-05-13 DIAGNOSIS — I4891 Unspecified atrial fibrillation: Secondary | ICD-10-CM | POA: Diagnosis not present

## 2017-05-14 DIAGNOSIS — H409 Unspecified glaucoma: Secondary | ICD-10-CM | POA: Diagnosis not present

## 2017-05-14 DIAGNOSIS — G309 Alzheimer's disease, unspecified: Secondary | ICD-10-CM | POA: Diagnosis not present

## 2017-05-14 DIAGNOSIS — S81801D Unspecified open wound, right lower leg, subsequent encounter: Secondary | ICD-10-CM | POA: Diagnosis not present

## 2017-05-14 DIAGNOSIS — I4891 Unspecified atrial fibrillation: Secondary | ICD-10-CM | POA: Diagnosis not present

## 2017-05-14 DIAGNOSIS — I10 Essential (primary) hypertension: Secondary | ICD-10-CM | POA: Diagnosis not present

## 2017-05-14 DIAGNOSIS — F0281 Dementia in other diseases classified elsewhere with behavioral disturbance: Secondary | ICD-10-CM | POA: Diagnosis not present

## 2017-05-14 DIAGNOSIS — R69 Illness, unspecified: Secondary | ICD-10-CM | POA: Diagnosis not present

## 2017-05-14 DIAGNOSIS — G301 Alzheimer's disease with late onset: Secondary | ICD-10-CM | POA: Diagnosis not present

## 2017-05-16 DIAGNOSIS — I10 Essential (primary) hypertension: Secondary | ICD-10-CM | POA: Diagnosis not present

## 2017-05-16 DIAGNOSIS — G309 Alzheimer's disease, unspecified: Secondary | ICD-10-CM | POA: Diagnosis not present

## 2017-05-16 DIAGNOSIS — H409 Unspecified glaucoma: Secondary | ICD-10-CM | POA: Diagnosis not present

## 2017-05-16 DIAGNOSIS — H8111 Benign paroxysmal vertigo, right ear: Secondary | ICD-10-CM | POA: Diagnosis not present

## 2017-05-16 DIAGNOSIS — I4891 Unspecified atrial fibrillation: Secondary | ICD-10-CM | POA: Diagnosis not present

## 2017-05-20 DIAGNOSIS — E039 Hypothyroidism, unspecified: Secondary | ICD-10-CM | POA: Diagnosis not present

## 2017-05-20 DIAGNOSIS — I10 Essential (primary) hypertension: Secondary | ICD-10-CM | POA: Diagnosis not present

## 2017-05-20 DIAGNOSIS — H8111 Benign paroxysmal vertigo, right ear: Secondary | ICD-10-CM | POA: Diagnosis not present

## 2017-05-20 DIAGNOSIS — I4891 Unspecified atrial fibrillation: Secondary | ICD-10-CM | POA: Diagnosis not present

## 2017-05-20 DIAGNOSIS — E559 Vitamin D deficiency, unspecified: Secondary | ICD-10-CM | POA: Diagnosis not present

## 2017-05-20 DIAGNOSIS — G309 Alzheimer's disease, unspecified: Secondary | ICD-10-CM | POA: Diagnosis not present

## 2017-05-20 DIAGNOSIS — H409 Unspecified glaucoma: Secondary | ICD-10-CM | POA: Diagnosis not present

## 2017-05-23 DIAGNOSIS — H409 Unspecified glaucoma: Secondary | ICD-10-CM | POA: Diagnosis not present

## 2017-05-23 DIAGNOSIS — W1830XA Fall on same level, unspecified, initial encounter: Secondary | ICD-10-CM | POA: Diagnosis not present

## 2017-05-23 DIAGNOSIS — R110 Nausea: Secondary | ICD-10-CM | POA: Diagnosis not present

## 2017-05-23 DIAGNOSIS — H8111 Benign paroxysmal vertigo, right ear: Secondary | ICD-10-CM | POA: Diagnosis not present

## 2017-05-23 DIAGNOSIS — I4891 Unspecified atrial fibrillation: Secondary | ICD-10-CM | POA: Diagnosis not present

## 2017-05-23 DIAGNOSIS — I10 Essential (primary) hypertension: Secondary | ICD-10-CM | POA: Diagnosis not present

## 2017-05-23 DIAGNOSIS — G309 Alzheimer's disease, unspecified: Secondary | ICD-10-CM | POA: Diagnosis not present

## 2017-05-24 DIAGNOSIS — G309 Alzheimer's disease, unspecified: Secondary | ICD-10-CM | POA: Diagnosis not present

## 2017-05-24 DIAGNOSIS — H8111 Benign paroxysmal vertigo, right ear: Secondary | ICD-10-CM | POA: Diagnosis not present

## 2017-05-24 DIAGNOSIS — H409 Unspecified glaucoma: Secondary | ICD-10-CM | POA: Diagnosis not present

## 2017-05-24 DIAGNOSIS — I4891 Unspecified atrial fibrillation: Secondary | ICD-10-CM | POA: Diagnosis not present

## 2017-05-24 DIAGNOSIS — I10 Essential (primary) hypertension: Secondary | ICD-10-CM | POA: Diagnosis not present

## 2017-05-27 DIAGNOSIS — G301 Alzheimer's disease with late onset: Secondary | ICD-10-CM | POA: Diagnosis not present

## 2017-05-27 DIAGNOSIS — R69 Illness, unspecified: Secondary | ICD-10-CM | POA: Diagnosis not present

## 2017-05-28 DIAGNOSIS — I4891 Unspecified atrial fibrillation: Secondary | ICD-10-CM | POA: Diagnosis not present

## 2017-05-28 DIAGNOSIS — Z008 Encounter for other general examination: Secondary | ICD-10-CM | POA: Diagnosis not present

## 2017-05-28 DIAGNOSIS — I482 Chronic atrial fibrillation: Secondary | ICD-10-CM | POA: Diagnosis not present

## 2017-05-28 DIAGNOSIS — E559 Vitamin D deficiency, unspecified: Secondary | ICD-10-CM | POA: Diagnosis not present

## 2017-05-28 DIAGNOSIS — I10 Essential (primary) hypertension: Secondary | ICD-10-CM | POA: Diagnosis not present

## 2017-05-28 DIAGNOSIS — F0281 Dementia in other diseases classified elsewhere with behavioral disturbance: Secondary | ICD-10-CM | POA: Diagnosis not present

## 2017-05-28 DIAGNOSIS — G4709 Other insomnia: Secondary | ICD-10-CM | POA: Diagnosis not present

## 2017-05-28 DIAGNOSIS — E039 Hypothyroidism, unspecified: Secondary | ICD-10-CM | POA: Diagnosis not present

## 2017-05-28 DIAGNOSIS — R69 Illness, unspecified: Secondary | ICD-10-CM | POA: Diagnosis not present

## 2017-05-28 DIAGNOSIS — G309 Alzheimer's disease, unspecified: Secondary | ICD-10-CM | POA: Diagnosis not present

## 2017-05-28 DIAGNOSIS — H409 Unspecified glaucoma: Secondary | ICD-10-CM | POA: Diagnosis not present

## 2017-05-28 DIAGNOSIS — H8111 Benign paroxysmal vertigo, right ear: Secondary | ICD-10-CM | POA: Diagnosis not present

## 2017-05-28 DIAGNOSIS — G301 Alzheimer's disease with late onset: Secondary | ICD-10-CM | POA: Diagnosis not present

## 2017-05-28 DIAGNOSIS — R296 Repeated falls: Secondary | ICD-10-CM | POA: Diagnosis not present

## 2017-05-29 DIAGNOSIS — I4891 Unspecified atrial fibrillation: Secondary | ICD-10-CM | POA: Diagnosis not present

## 2017-05-29 DIAGNOSIS — G309 Alzheimer's disease, unspecified: Secondary | ICD-10-CM | POA: Diagnosis not present

## 2017-05-29 DIAGNOSIS — I10 Essential (primary) hypertension: Secondary | ICD-10-CM | POA: Diagnosis not present

## 2017-05-29 DIAGNOSIS — H8111 Benign paroxysmal vertigo, right ear: Secondary | ICD-10-CM | POA: Diagnosis not present

## 2017-05-29 DIAGNOSIS — H409 Unspecified glaucoma: Secondary | ICD-10-CM | POA: Diagnosis not present

## 2017-05-30 DIAGNOSIS — G309 Alzheimer's disease, unspecified: Secondary | ICD-10-CM | POA: Diagnosis not present

## 2017-05-30 DIAGNOSIS — H8111 Benign paroxysmal vertigo, right ear: Secondary | ICD-10-CM | POA: Diagnosis not present

## 2017-05-30 DIAGNOSIS — H409 Unspecified glaucoma: Secondary | ICD-10-CM | POA: Diagnosis not present

## 2017-05-30 DIAGNOSIS — I4891 Unspecified atrial fibrillation: Secondary | ICD-10-CM | POA: Diagnosis not present

## 2017-05-30 DIAGNOSIS — I10 Essential (primary) hypertension: Secondary | ICD-10-CM | POA: Diagnosis not present

## 2017-05-30 DIAGNOSIS — R300 Dysuria: Secondary | ICD-10-CM | POA: Diagnosis not present

## 2017-05-31 DIAGNOSIS — H8111 Benign paroxysmal vertigo, right ear: Secondary | ICD-10-CM | POA: Diagnosis not present

## 2017-05-31 DIAGNOSIS — H409 Unspecified glaucoma: Secondary | ICD-10-CM | POA: Diagnosis not present

## 2017-05-31 DIAGNOSIS — I4891 Unspecified atrial fibrillation: Secondary | ICD-10-CM | POA: Diagnosis not present

## 2017-05-31 DIAGNOSIS — G309 Alzheimer's disease, unspecified: Secondary | ICD-10-CM | POA: Diagnosis not present

## 2017-05-31 DIAGNOSIS — I10 Essential (primary) hypertension: Secondary | ICD-10-CM | POA: Diagnosis not present

## 2017-06-02 DIAGNOSIS — M79672 Pain in left foot: Secondary | ICD-10-CM | POA: Diagnosis not present

## 2017-06-02 DIAGNOSIS — E872 Acidosis: Secondary | ICD-10-CM | POA: Diagnosis not present

## 2017-06-02 DIAGNOSIS — Z7189 Other specified counseling: Secondary | ICD-10-CM | POA: Diagnosis not present

## 2017-06-02 DIAGNOSIS — I10 Essential (primary) hypertension: Secondary | ICD-10-CM | POA: Diagnosis not present

## 2017-06-02 DIAGNOSIS — M79661 Pain in right lower leg: Secondary | ICD-10-CM | POA: Diagnosis not present

## 2017-06-02 DIAGNOSIS — G9340 Encephalopathy, unspecified: Secondary | ICD-10-CM | POA: Diagnosis not present

## 2017-06-02 DIAGNOSIS — L03115 Cellulitis of right lower limb: Secondary | ICD-10-CM | POA: Diagnosis not present

## 2017-06-02 DIAGNOSIS — I482 Chronic atrial fibrillation: Secondary | ICD-10-CM | POA: Diagnosis not present

## 2017-06-02 DIAGNOSIS — M79662 Pain in left lower leg: Secondary | ICD-10-CM | POA: Diagnosis not present

## 2017-06-02 DIAGNOSIS — L03116 Cellulitis of left lower limb: Secondary | ICD-10-CM | POA: Diagnosis not present

## 2017-06-02 DIAGNOSIS — E222 Syndrome of inappropriate secretion of antidiuretic hormone: Secondary | ICD-10-CM | POA: Diagnosis not present

## 2017-06-02 DIAGNOSIS — I4891 Unspecified atrial fibrillation: Secondary | ICD-10-CM | POA: Diagnosis not present

## 2017-06-02 DIAGNOSIS — Y92092 Bedroom in other non-institutional residence as the place of occurrence of the external cause: Secondary | ICD-10-CM | POA: Diagnosis not present

## 2017-06-02 DIAGNOSIS — R410 Disorientation, unspecified: Secondary | ICD-10-CM | POA: Diagnosis not present

## 2017-06-02 DIAGNOSIS — R630 Anorexia: Secondary | ICD-10-CM | POA: Diagnosis not present

## 2017-06-02 DIAGNOSIS — T461X5A Adverse effect of calcium-channel blockers, initial encounter: Secondary | ICD-10-CM | POA: Diagnosis not present

## 2017-06-02 DIAGNOSIS — E860 Dehydration: Secondary | ICD-10-CM | POA: Diagnosis not present

## 2017-06-02 DIAGNOSIS — S81802A Unspecified open wound, left lower leg, initial encounter: Secondary | ICD-10-CM | POA: Diagnosis not present

## 2017-06-02 DIAGNOSIS — I872 Venous insufficiency (chronic) (peripheral): Secondary | ICD-10-CM | POA: Diagnosis not present

## 2017-06-02 DIAGNOSIS — M79671 Pain in right foot: Secondary | ICD-10-CM | POA: Diagnosis not present

## 2017-06-02 DIAGNOSIS — I87312 Chronic venous hypertension (idiopathic) with ulcer of left lower extremity: Secondary | ICD-10-CM | POA: Diagnosis not present

## 2017-06-02 DIAGNOSIS — R69 Illness, unspecified: Secondary | ICD-10-CM | POA: Diagnosis not present

## 2017-06-02 DIAGNOSIS — R0789 Other chest pain: Secondary | ICD-10-CM | POA: Diagnosis not present

## 2017-06-02 DIAGNOSIS — R4182 Altered mental status, unspecified: Secondary | ICD-10-CM | POA: Diagnosis not present

## 2017-06-02 DIAGNOSIS — A419 Sepsis, unspecified organism: Secondary | ICD-10-CM | POA: Diagnosis not present

## 2017-06-02 DIAGNOSIS — G9341 Metabolic encephalopathy: Secondary | ICD-10-CM | POA: Diagnosis not present

## 2017-06-02 DIAGNOSIS — E871 Hypo-osmolality and hyponatremia: Secondary | ICD-10-CM | POA: Diagnosis not present

## 2017-06-02 DIAGNOSIS — E778 Other disorders of glycoprotein metabolism: Secondary | ICD-10-CM | POA: Diagnosis not present

## 2017-06-02 DIAGNOSIS — G309 Alzheimer's disease, unspecified: Secondary | ICD-10-CM | POA: Diagnosis not present

## 2017-06-02 DIAGNOSIS — R6520 Severe sepsis without septic shock: Secondary | ICD-10-CM | POA: Diagnosis not present

## 2017-06-02 DIAGNOSIS — H409 Unspecified glaucoma: Secondary | ICD-10-CM | POA: Diagnosis not present

## 2017-06-02 DIAGNOSIS — H8111 Benign paroxysmal vertigo, right ear: Secondary | ICD-10-CM | POA: Diagnosis not present

## 2017-06-07 DIAGNOSIS — G301 Alzheimer's disease with late onset: Secondary | ICD-10-CM | POA: Diagnosis not present

## 2017-06-07 DIAGNOSIS — R69 Illness, unspecified: Secondary | ICD-10-CM | POA: Diagnosis not present

## 2017-07-13 DEATH — deceased
